# Patient Record
Sex: Female | Born: 1955
Health system: Southern US, Community
[De-identification: ages and names within clinical notes are randomized; demographics above are authoritative.]

## PROBLEM LIST (undated history)

## (undated) ENCOUNTER — Telehealth

## (undated) ENCOUNTER — Encounter

## (undated) ENCOUNTER — Encounter: Attending: Nephrology | Primary: Nephrology

## (undated) ENCOUNTER — Telehealth: Attending: Nephrology | Primary: Nephrology

## (undated) ENCOUNTER — Ambulatory Visit: Payer: PRIVATE HEALTH INSURANCE

## (undated) ENCOUNTER — Ambulatory Visit

## (undated) ENCOUNTER — Encounter: Payer: PRIVATE HEALTH INSURANCE | Attending: Nephrology | Primary: Nephrology

## (undated) ENCOUNTER — Telehealth
Attending: Student in an Organized Health Care Education/Training Program | Primary: Student in an Organized Health Care Education/Training Program

## (undated) ENCOUNTER — Ambulatory Visit: Payer: PRIVATE HEALTH INSURANCE | Attending: Nephrology | Primary: Nephrology

## (undated) ENCOUNTER — Non-Acute Institutional Stay: Payer: PRIVATE HEALTH INSURANCE

## (undated) ENCOUNTER — Ambulatory Visit: Payer: MEDICARE | Attending: Nephrology | Primary: Nephrology

## (undated) ENCOUNTER — Non-Acute Institutional Stay: Payer: PRIVATE HEALTH INSURANCE | Attending: Nephrology | Primary: Nephrology

## (undated) DIAGNOSIS — N189 Chronic kidney disease, unspecified: Secondary | ICD-10-CM

## (undated) DIAGNOSIS — G4733 Obstructive sleep apnea (adult) (pediatric): Secondary | ICD-10-CM

## (undated) DIAGNOSIS — Q613 Polycystic kidney, unspecified: Secondary | ICD-10-CM

## (undated) DIAGNOSIS — I639 Cerebral infarction, unspecified: Secondary | ICD-10-CM

## (undated) DIAGNOSIS — D259 Leiomyoma of uterus, unspecified: Secondary | ICD-10-CM

## (undated) DIAGNOSIS — R233 Spontaneous ecchymoses: Secondary | ICD-10-CM

## (undated) DIAGNOSIS — Z960 Presence of urogenital implants: Secondary | ICD-10-CM

## (undated) DIAGNOSIS — K649 Unspecified hemorrhoids: Secondary | ICD-10-CM

## (undated) DIAGNOSIS — K5790 Diverticulosis of intestine, part unspecified, without perforation or abscess without bleeding: Secondary | ICD-10-CM

## (undated) DIAGNOSIS — R091 Pleurisy: Secondary | ICD-10-CM

## (undated) DIAGNOSIS — D649 Anemia, unspecified: Secondary | ICD-10-CM

## (undated) DIAGNOSIS — R238 Other skin changes: Secondary | ICD-10-CM

## (undated) DIAGNOSIS — K5792 Diverticulitis of intestine, part unspecified, without perforation or abscess without bleeding: Secondary | ICD-10-CM

## (undated) DIAGNOSIS — I10 Essential (primary) hypertension: Secondary | ICD-10-CM

## (undated) DIAGNOSIS — K828 Other specified diseases of gallbladder: Secondary | ICD-10-CM

## (undated) DIAGNOSIS — M792 Neuralgia and neuritis, unspecified: Secondary | ICD-10-CM

## (undated) DIAGNOSIS — M1990 Unspecified osteoarthritis, unspecified site: Secondary | ICD-10-CM

## (undated) DIAGNOSIS — E785 Hyperlipidemia, unspecified: Secondary | ICD-10-CM

## (undated) HISTORY — DX: Unspecified hemorrhoids: K64.9

## (undated) HISTORY — PX: HIP SURGERY: SHX245

## (undated) HISTORY — DX: Presence of urogenital implants: Z96.0

## (undated) HISTORY — DX: Chronic kidney disease, unspecified: N18.9

## (undated) HISTORY — PX: TONSILLECTOMY: SUR1361

## (undated) HISTORY — DX: Leiomyoma of uterus, unspecified: D25.9

## (undated) HISTORY — DX: Obstructive sleep apnea (adult) (pediatric): G47.33

## (undated) HISTORY — DX: Other specified diseases of gallbladder: K82.8

## (undated) HISTORY — DX: Anemia, unspecified: D64.9

## (undated) HISTORY — DX: Neuralgia and neuritis, unspecified: M79.2

## (undated) HISTORY — DX: Hyperlipidemia, unspecified: E78.5

## (undated) HISTORY — DX: Diverticulosis of intestine, part unspecified, without perforation or abscess without bleeding: K57.90

## (undated) HISTORY — DX: Diverticulitis of intestine, part unspecified, without perforation or abscess without bleeding: K57.92

## (undated) HISTORY — PX: WRIST FRACTURE SURGERY: SHX121

## (undated) HISTORY — PX: FRACTURE SURGERY: SHX138

## (undated) HISTORY — PX: ANKLE FRACTURE SURGERY: SHX122

## (undated) HISTORY — PX: MANDIBLE SURGERY: SHX707

---
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
## 1978-06-01 HISTORY — PX: OTHER SURGICAL HISTORY: SHX169

## 1998-02-06 ENCOUNTER — Other Ambulatory Visit: Admission: RE | Admit: 1998-02-06 | Discharge: 1998-02-06 | Payer: Self-pay | Admitting: Nephrology

## 1999-09-04 ENCOUNTER — Other Ambulatory Visit: Admission: RE | Admit: 1999-09-04 | Discharge: 1999-09-04 | Payer: Self-pay | Admitting: Obstetrics and Gynecology

## 1999-12-11 ENCOUNTER — Other Ambulatory Visit: Admission: RE | Admit: 1999-12-11 | Discharge: 1999-12-11 | Payer: Self-pay | Admitting: Obstetrics and Gynecology

## 2000-03-22 ENCOUNTER — Other Ambulatory Visit: Admission: RE | Admit: 2000-03-22 | Discharge: 2000-03-22 | Payer: Self-pay | Admitting: Obstetrics and Gynecology

## 2000-09-08 ENCOUNTER — Other Ambulatory Visit: Admission: RE | Admit: 2000-09-08 | Discharge: 2000-09-08 | Payer: Self-pay | Admitting: Obstetrics and Gynecology

## 2001-03-13 ENCOUNTER — Other Ambulatory Visit: Admission: RE | Admit: 2001-03-13 | Discharge: 2001-03-13 | Payer: Self-pay | Admitting: Obstetrics and Gynecology

## 2001-09-07 ENCOUNTER — Other Ambulatory Visit: Admission: RE | Admit: 2001-09-07 | Discharge: 2001-09-07 | Payer: Self-pay | Admitting: Obstetrics and Gynecology

## 2002-10-18 ENCOUNTER — Other Ambulatory Visit: Admission: RE | Admit: 2002-10-18 | Discharge: 2002-10-18 | Payer: Self-pay | Admitting: Obstetrics and Gynecology

## 2002-11-01 HISTORY — PX: LAPAROSCOPY: SHX197

## 2002-11-01 HISTORY — PX: OTHER SURGICAL HISTORY: SHX169

## 2003-11-15 ENCOUNTER — Other Ambulatory Visit: Admission: RE | Admit: 2003-11-15 | Discharge: 2003-11-15 | Payer: Self-pay | Admitting: Obstetrics and Gynecology

## 2004-11-24 ENCOUNTER — Other Ambulatory Visit: Admission: RE | Admit: 2004-11-24 | Discharge: 2004-11-24 | Payer: Self-pay | Admitting: *Deleted

## 2005-01-15 ENCOUNTER — Ambulatory Visit (HOSPITAL_COMMUNITY): Admission: RE | Admit: 2005-01-15 | Discharge: 2005-01-15 | Payer: Self-pay | Admitting: Orthopaedic Surgery

## 2005-01-15 ENCOUNTER — Ambulatory Visit (HOSPITAL_BASED_OUTPATIENT_CLINIC_OR_DEPARTMENT_OTHER): Admission: RE | Admit: 2005-01-15 | Discharge: 2005-01-15 | Payer: Self-pay | Admitting: Orthopaedic Surgery

## 2005-12-28 ENCOUNTER — Other Ambulatory Visit: Admission: RE | Admit: 2005-12-28 | Discharge: 2005-12-28 | Payer: Self-pay | Admitting: Obstetrics & Gynecology

## 2006-12-01 DIAGNOSIS — Q612 Polycystic kidney, adult type: Secondary | ICD-10-CM | POA: Insufficient documentation

## 2006-12-29 ENCOUNTER — Other Ambulatory Visit: Admission: RE | Admit: 2006-12-29 | Discharge: 2006-12-29 | Payer: Self-pay | Admitting: Obstetrics & Gynecology

## 2008-01-04 ENCOUNTER — Other Ambulatory Visit: Admission: RE | Admit: 2008-01-04 | Discharge: 2008-01-04 | Payer: Self-pay | Admitting: Obstetrics & Gynecology

## 2008-07-17 ENCOUNTER — Encounter: Admission: RE | Admit: 2008-07-17 | Discharge: 2008-07-17 | Payer: Self-pay | Admitting: Orthopaedic Surgery

## 2009-01-08 ENCOUNTER — Other Ambulatory Visit: Admission: RE | Admit: 2009-01-08 | Discharge: 2009-01-08 | Payer: Self-pay | Admitting: Obstetrics & Gynecology

## 2009-10-21 ENCOUNTER — Encounter: Admission: RE | Admit: 2009-10-21 | Discharge: 2009-10-21 | Payer: Self-pay | Admitting: Orthopaedic Surgery

## 2009-11-01 DIAGNOSIS — I639 Cerebral infarction, unspecified: Secondary | ICD-10-CM

## 2009-11-01 HISTORY — DX: Cerebral infarction, unspecified: I63.9

## 2010-06-13 DIAGNOSIS — I6350 Cerebral infarction due to unspecified occlusion or stenosis of unspecified cerebral artery: Secondary | ICD-10-CM | POA: Insufficient documentation

## 2010-11-26 ENCOUNTER — Encounter
Admission: RE | Admit: 2010-11-26 | Discharge: 2010-11-26 | Payer: Self-pay | Source: Home / Self Care | Attending: Orthopaedic Surgery | Admitting: Orthopaedic Surgery

## 2011-03-19 NOTE — Op Note (Signed)
Linda Gilmore, Linda Gilmore              ACCOUNT NO.:  [id number]   MEDICAL RECORD NO.:  [id number]          PATIENT TYPE:  AMB   LOCATION:  DSC                          FACILITY:  [facility]   PHYSICIAN:  Claude Manges. Whitfield, M.D.DATE OF BIRTH:  [birth year]   DATE OF PROCEDURE:  01/15/2005  DATE OF DISCHARGE:                                 OPERATIVE REPORT   PREOPERATIVE DIAGNOSIS:  Traumatic arthrosis left ankle with synovitis.   POSTOPERATIVE DIAGNOSIS:  Traumatic arthrosis left ankle with synovitis.   PROCEDURE:  Arthroscopic debridement of articular cartilage left ankle with  synovectomy.   SURGEON:  Claude Manges. Cleophas Dunker, M.D.   ASSISTANT:  Richardean Canal, P.A.   ANESTHESIA:  General LMA.   COMPLICATIONS:  None.   INDICATIONS FOR PROCEDURE:  This 56 year old female has developed a  traumatic arthrosis of her left ankle as the result of an accident many  years ago.  She has been followed over a period of years with occasional  cortisone injections and even recently underwent a series of five Hyalgan  injections.  She continues to have pain and swelling on a daily basis  without much relief.  She is unable to take prolonged courses of anti-  inflammatory medicines as a result of renal disease.  Therefore, is limited  in her treatment.  She has had a recent MRI scan that revealed diffuse  degenerative changes with even subchondral cysts consistent with her  arthrosis.  She will now have an arthroscopy to evaluate the ankle and  possibly debride any synovitis which may ultimately make a difference.   DESCRIPTION OF PROCEDURE:  With the patient comfortable on the operating  table and under LMA general anesthesia, a tourniquet was applied to the left  lower extremity.  The left lower extremity was appropriately identified as  the operative extremity preoperatively.  The left lower extremity was then  prepped with Duraprep from the tips of the toes to the knee with Duraprep.  Sterile  draping was performed.   A marking pen was used to outline the arthroscopic portals.  Medial to the  anterior tibial tendon and lateral to the extensor tendons.  These portals  were then infiltrated with 0.25% Marcaine with epinephrine.  A small stab  wound was made in the skin and then Hemostats were used to separate soft  tissue through the capsule.  The smooth ankle scope is then placed into the  joint.  A diagnostic arthroscopy revealed diffuse areas of articular  cartilage loss both under the articular surface of the tibia as well as the  talus.  There was diffuse synovitis in both the medial and lateral gutters  and even anteriorly.  A second portal was established laterally with a  similar procedure using the Hemostats to elevate soft tissue.  The small  nonaggressive shaver was introduced.  A synovectomy was performed.  A small  angled ArthroCare wand was also inserted to remove any synovitis.  I thought  we had a very nice synovectomy.  She was having more trouble in the lateral  gutter where there was more synovitis and this was  completely debrided.  Any  loose areas of articular cartilage were also removed.  Without any evidence  of any further synovitis, the joint was irrigated with saline solution and  the medial puncture site was closed with interrupted 4-0 Ethilon, the  lateral  puncture site was left open.  The wounds were again infiltrated with 0.25%  Marcaine with epinephrine and sterile bulky dressing was applied followed by  an Ace bandage and an Futures trader.   PLAN:  Office in 1 week, crutches, Vicodin for pain.      PWW/MEDQ  D:  01/15/2005  T:  01/15/2005  Job:  [id number]

## 2011-04-12 ENCOUNTER — Other Ambulatory Visit: Payer: Self-pay | Admitting: Orthopaedic Surgery

## 2011-04-12 DIAGNOSIS — M25511 Pain in right shoulder: Secondary | ICD-10-CM

## 2011-04-17 ENCOUNTER — Other Ambulatory Visit: Payer: Self-pay

## 2011-04-22 ENCOUNTER — Other Ambulatory Visit: Payer: Self-pay | Admitting: Orthopaedic Surgery

## 2011-04-22 DIAGNOSIS — M542 Cervicalgia: Secondary | ICD-10-CM

## 2011-04-25 ENCOUNTER — Ambulatory Visit
Admission: RE | Admit: 2011-04-25 | Discharge: 2011-04-25 | Disposition: A | Discharge status: Home / Self Care | Payer: Commercial Managed Care - PPO | Source: Ambulatory Visit | Attending: Orthopaedic Surgery | Admitting: Orthopaedic Surgery

## 2011-04-25 DIAGNOSIS — M25511 Pain in right shoulder: Secondary | ICD-10-CM

## 2011-04-25 DIAGNOSIS — M542 Cervicalgia: Secondary | ICD-10-CM

## 2011-04-25 MED ORDER — GADOBENATE DIMEGLUMINE 529 MG/ML IV SOLN
10.0000 mL | Freq: Once | INTRAVENOUS | Status: AC | PRN
  Administered 2011-04-25: 10 mL via INTRAVENOUS

## 2012-05-29 ENCOUNTER — Ambulatory Visit: Payer: Commercial Managed Care - PPO | Admitting: Physical Therapy

## 2012-05-31 ENCOUNTER — Ambulatory Visit: Payer: 59 | Attending: Family Medicine | Admitting: Physical Therapy

## 2012-05-31 DIAGNOSIS — R269 Unspecified abnormalities of gait and mobility: Secondary | ICD-10-CM | POA: Insufficient documentation

## 2012-05-31 DIAGNOSIS — IMO0001 Reserved for inherently not codable concepts without codable children: Secondary | ICD-10-CM | POA: Insufficient documentation

## 2012-06-01 ENCOUNTER — Ambulatory Visit: Payer: 59 | Attending: Family Medicine | Admitting: Physical Therapy

## 2012-06-01 DIAGNOSIS — IMO0001 Reserved for inherently not codable concepts without codable children: Secondary | ICD-10-CM | POA: Insufficient documentation

## 2012-06-01 DIAGNOSIS — R269 Unspecified abnormalities of gait and mobility: Secondary | ICD-10-CM | POA: Insufficient documentation

## 2012-06-14 ENCOUNTER — Ambulatory Visit: Payer: Commercial Managed Care - PPO | Admitting: *Deleted

## 2012-06-16 ENCOUNTER — Ambulatory Visit: Payer: 59 | Admitting: *Deleted

## 2012-06-19 ENCOUNTER — Ambulatory Visit: Payer: Commercial Managed Care - PPO | Admitting: Physical Therapy

## 2012-06-20 ENCOUNTER — Emergency Department (HOSPITAL_COMMUNITY): Payer: 59

## 2012-06-20 ENCOUNTER — Emergency Department (HOSPITAL_COMMUNITY)
Admission: EM | Admit: 2012-06-20 | Discharge: 2012-06-20 | Disposition: A | Discharge status: Home / Self Care | Payer: 59 | Attending: Emergency Medicine | Admitting: Emergency Medicine

## 2012-06-20 ENCOUNTER — Encounter (HOSPITAL_COMMUNITY): Payer: Self-pay | Admitting: *Deleted

## 2012-06-20 ENCOUNTER — Encounter (HOSPITAL_COMMUNITY): Payer: Self-pay | Admitting: Emergency Medicine

## 2012-06-20 DIAGNOSIS — K5792 Diverticulitis of intestine, part unspecified, without perforation or abscess without bleeding: Secondary | ICD-10-CM

## 2012-06-20 DIAGNOSIS — Z8673 Personal history of transient ischemic attack (TIA), and cerebral infarction without residual deficits: Secondary | ICD-10-CM | POA: Insufficient documentation

## 2012-06-20 DIAGNOSIS — I10 Essential (primary) hypertension: Secondary | ICD-10-CM | POA: Insufficient documentation

## 2012-06-20 DIAGNOSIS — R10814 Left lower quadrant abdominal tenderness: Secondary | ICD-10-CM | POA: Insufficient documentation

## 2012-06-20 DIAGNOSIS — R109 Unspecified abdominal pain: Secondary | ICD-10-CM | POA: Insufficient documentation

## 2012-06-20 DIAGNOSIS — K5732 Diverticulitis of large intestine without perforation or abscess without bleeding: Secondary | ICD-10-CM | POA: Insufficient documentation

## 2012-06-20 DIAGNOSIS — K7689 Other specified diseases of liver: Secondary | ICD-10-CM | POA: Insufficient documentation

## 2012-06-20 HISTORY — DX: Polycystic kidney, unspecified: Q61.3

## 2012-06-20 HISTORY — DX: Essential (primary) hypertension: I10

## 2012-06-20 HISTORY — DX: Unspecified osteoarthritis, unspecified site: M19.90

## 2012-06-20 HISTORY — DX: Diverticulitis of intestine, part unspecified, without perforation or abscess without bleeding: K57.92

## 2012-06-20 HISTORY — DX: Cerebral infarction, unspecified: I63.9

## 2012-06-20 LAB — URINALYSIS, ROUTINE W REFLEX MICROSCOPIC
Bilirubin Urine: NEGATIVE
Glucose, UA: NEGATIVE mg/dL
Ketones, ur: NEGATIVE mg/dL
Protein, ur: 100 mg/dL — AB
Urobilinogen, UA: 0.2 mg/dL (ref 0.0–1.0)

## 2012-06-20 LAB — POCT I-STAT, CHEM 8
BUN: 35 mg/dL — ABNORMAL HIGH (ref 6–23)
Calcium, Ion: 1.31 mmol/L — ABNORMAL HIGH (ref 1.12–1.23)
Hemoglobin: 11.9 g/dL — ABNORMAL LOW (ref 12.0–15.0)
Sodium: 140 mEq/L (ref 135–145)
TCO2: 24 mmol/L (ref 0–100)

## 2012-06-20 LAB — CBC WITH DIFFERENTIAL/PLATELET
Basophils Absolute: 0 10*3/uL (ref 0.0–0.1)
Basophils Relative: 0 % (ref 0–1)
Eosinophils Absolute: 0.1 10*3/uL (ref 0.0–0.7)
Eosinophils Relative: 1 % (ref 0–5)
HCT: 35.5 % — ABNORMAL LOW (ref 36.0–46.0)
Lymphocytes Relative: 8 % — ABNORMAL LOW (ref 12–46)
MCH: 29.3 pg (ref 26.0–34.0)
MCHC: 33 g/dL (ref 30.0–36.0)
MCV: 88.8 fL (ref 78.0–100.0)
Monocytes Absolute: 0.7 10*3/uL (ref 0.1–1.0)
Platelets: 194 10*3/uL (ref 150–400)
RDW: 12.8 % (ref 11.5–15.5)

## 2012-06-20 LAB — URINE MICROSCOPIC-ADD ON

## 2012-06-20 MED ORDER — IOHEXOL 300 MG/ML  SOLN: 20.0000 mL | INTRAMUSCULAR | Status: DC

## 2012-06-20 MED ORDER — CIPROFLOXACIN HCL 500 MG PO TABS: 500.0000 mg | ORAL_TABLET | Freq: Two times a day (BID) | ORAL | Status: AC

## 2012-06-20 MED ORDER — METRONIDAZOLE 500 MG PO TABS: 500.0000 mg | ORAL_TABLET | Freq: Two times a day (BID) | ORAL | Status: AC

## 2012-06-20 NOTE — ED Notes (Signed)
Transported to CT scan via stretcher per xray staff

## 2012-06-20 NOTE — ED Notes (Signed)
Patient states she is leaving without being seen and will return after she takes her son to his doctors appointment.

## 2012-06-20 NOTE — ED Notes (Signed)
Pt was here earlier today and had to leave and is now back c/o LLQ pain; pt went to [facility] and nephrologist today; pt with polycystic kidney disease and thinks it could be cyst rupture; pt had labs in previous visit

## 2012-06-20 NOTE — ED Provider Notes (Signed)
Medical screening examination/treatment/procedure(s) were performed by non-physician practitioner and as supervising physician I was immediately available for consultation/collaboration.   Richardean Canal, MD 06/20/12 [phone]

## 2012-06-20 NOTE — ED Provider Notes (Signed)
History     CSN: [id number]  Arrival date & time 06/20/12  1739   First MD Initiated Contact with Patient 06/20/12 1942      Chief Complaint  Patient presents with  . Abdominal Pain    (Consider location/radiation/quality/duration/timing/severity/associated sxs/prior treatment) HPI Comments: 56 y/o female presents with sudden onset abdominal pain last night while laying in bed. Came to ED earlier today, but had to leave before all tests were complete in order to take her son to an appt and said she would return later. Pain last night was throbbing with occasional sharp episodes rated 8/10. Pain decreased today to 4/10 worse with movement. Went to her gynecologist this morning who examined her and said she may have a cyst or a kidney stone, but did not have ultrasound in office and told her to see her nephrologist. She has hx of polycystic kidney disease. Went to nephrologists office who could not see her today and advised her to go to ED for a scan. Labs were drawn earlier today. States pain is located mostly in left suprapubic region. Denies any dysuria, hematuria, back pain, fever, chills, nausea, vomiting, chest pain, sob, vaginal bleeding or discharge.  Patient is a 56 y.o. female presenting with abdominal pain. The history is provided by the patient.  Abdominal Pain The primary symptoms of the illness include abdominal pain. The primary symptoms of the illness do not include fever, shortness of breath, nausea, vomiting, dysuria, vaginal discharge or vaginal bleeding.  Symptoms associated with the illness do not include chills, hematuria, frequency or back pain.    Past Medical History  Diagnosis Date  . Hypertension   . Stroke   . Arthritis   . Polycystic kidney disease     Past Surgical History  Procedure Date  . Tonsillectomy   . Joint replacement     multiple surgery  . Mandible surgery     History reviewed. No pertinent family history.  History  Substance Use Topics   . Smoking status: Never Smoker   . Smokeless tobacco: Not on file  . Alcohol Use: No    OB History    Grav Para Term Preterm Abortions TAB SAB Ect Mult Living                  Review of Systems  Constitutional: Negative for fever and chills.  Respiratory: Negative for shortness of breath.   Cardiovascular: Negative for chest pain.  Gastrointestinal: Positive for abdominal pain. Negative for nausea and vomiting.  Genitourinary: Negative for dysuria, frequency, hematuria, vaginal bleeding and vaginal discharge.  Musculoskeletal: Negative for back pain.    Allergies  Sulfa antibiotics  Home Medications   Current Outpatient Rx  Name Route Sig Dispense Refill  . AMLODIPINE BESYLATE 2.5 MG PO TABS Oral Take 2.5 mg by mouth daily.    . ASPIRIN EC 81 MG PO TBEC Oral Take 81 mg by mouth daily.    . ATORVASTATIN CALCIUM 10 MG PO TABS Oral Take 10 mg by mouth daily.    Marland Kitchen GLUCOSAMINE CHOND COMPLEX/MSM PO Oral Take 1 tablet by mouth 2 (two) times daily.    . ADULT MULTIVITAMIN W/MINERALS CH Oral Take 1 tablet by mouth daily.    . OMEGA-3-ACID ETHYL ESTERS 1 G PO CAPS Oral Take 2 g by mouth 3 (three) times daily.    Marland Kitchen PROBIOTIC DAILY PO CAPS Oral Take 1 capsule by mouth daily.    . TOLVAPTAN 15 MG PO TABS Oral Take 15-45 mg  by mouth 2 (two) times daily.    Marland Kitchen VALSARTAN 160 MG PO TABS Oral Take 160 mg by mouth 2 (two) times daily.    Marland Kitchen VITAMIN D (ERGOCALCIFEROL) 50000 UNITS PO CAPS Oral Take 50,000 Units by mouth every 7 (seven) days. Takes on Sunday      BP 148/81  Pulse 70  Temp 97 F (36.1 C) (Oral)  Resp 17  SpO2 97%  Physical Exam  Constitutional: She is oriented to person, place, and time. She appears well-developed and well-nourished. No distress.  HENT:  Head: Normocephalic and atraumatic.  Mouth/Throat: Oropharynx is clear and moist.  Eyes: Conjunctivae are normal.  Neck: Neck supple.  Cardiovascular: Normal rate, regular rhythm and normal heart sounds.     Pulmonary/Chest: Effort normal and breath sounds normal.  Abdominal: Soft. Normal appearance and bowel sounds are normal. There is tenderness in the left lower quadrant. There is no rigidity, no rebound, no guarding and no CVA tenderness.  Neurological: She is alert and oriented to person, place, and time.  Skin: Skin is warm and dry.  Psychiatric: She has a normal mood and affect. Her speech is normal and behavior is normal.    ED Course  Procedures (including critical care time)  Labs Reviewed - No data to display Ct Abdomen Pelvis Wo Contrast  06/20/2012  *RADIOLOGY REPORT*  Clinical Data: Left lower quadrant pain.  Microhematuria.  CT ABDOMEN AND PELVIS WITHOUT CONTRAST  Technique:  Multidetector CT imaging of the abdomen and pelvis was performed following the standard protocol without intravenous contrast.  Comparison: None.  Findings: Minimal dependent changes in the lung bases.  The kidneys are diffusely enlarged bilaterally with parenchymal replacement by multiple cysts consistent with polycystic kidney disease.  Multiple cysts in the liver consistent with associated polycystic liver disease.  No pyelocaliectasis or ureterectasis.  No renal, ureteral, or bladder stones.  No bladder wall thickening.  The unenhanced appearance of the gallbladder, spleen, pancreas, adrenal glands, abdominal aorta, and retroperitoneal lymph nodes is unremarkable.  Stomach, small bowel, and colon are not abnormally distended.  No free air or free fluid in the abdomen.  Pelvis:  Diverticulosis of the sigmoid colon.  Inflammatory infiltration in the pericolonic fat at the junction of the descending and sigmoid region consistent with diverticulitis.  No diverticular abscess is demonstrated.  No loculated fluid collections.  Lobular appearance of the uterus suggesting fibroids. The appendix is normal.  Normal alignment of the lumbar vertebrae.  IMPRESSION: Polycystic kidney and liver disease.  No renal or ureteral stone  or obstruction.  Left lower quadrant diverticulitis.   Original Report Authenticated By: Marlon Pel, M.D.    Labs from earlier today: URINALYSIS, ROUTINE W REFLEX MICROSCOPIC - Abnormal; Notable for the following:  APPearance  HAZY (*)  Hgb urine dipstick  LARGE (*)  Protein, ur  100 (*)  Leukocytes, UA  MODERATE (*)  All other components within normal limits  CBC WITH DIFFERENTIAL - Abnormal; Notable for the following:  WBC  10.9 (*)  Hemoglobin  11.7 (*)  HCT  35.5 (*)  Neutrophils Relative  85 (*)  Neutro Abs  9.3 (*)  Lymphocytes Relative  8 (*)  All other components within normal limits  POCT I-STAT, CHEM 8 - Abnormal; Notable for the following:  BUN  35 (*)  Creatinine, Ser  2.10 (*)  Glucose, Bld  152 (*)  Calcium, Ion  1.31 (*)  Hemoglobin  11.9 (*)  HCT  35.0 (*)  All other components  within normal limits  URINE MICROSCOPIC-ADD ON - Abnormal; Notable for the following:  Bacteria, UA  FEW (*)  All other components within normal limits    1. Diverticulitis   2. Liver cyst   3. Abdominal pain       MDM  56 y/o female with diverticulitis. Will treat with cipro and flagyl and give f/u with GI due to cysts seen on liver. Aware to f/u with PCP. Instructions for low residue diet discussed.        Trevor Mace, PA-C 06/20/12 2126

## 2012-06-20 NOTE — ED Notes (Signed)
Pt was sent here from Dr. Caryn Section office for an ultrasound.  PT started having LLQ Abdominal pain that increased since last nite,  Pt has polycystic kidney disease and sent here for possible kidney stone.

## 2012-06-20 NOTE — ED Notes (Signed)
From CT scan via stretcher per xray tech

## 2012-06-20 NOTE — ED Notes (Addendum)
Patient states she has been having abdominal pain that started last night approx 2200 and is getting worse as the day goes on today and is having pain when she walks. Patient denies N/V//F/D. Patient denies flank pain and denies pain with urination. Patient states she has to leave by 1545 to take her son to a doctors appointment and if not seen by doctor she will return after her son's appointment.

## 2012-06-23 ENCOUNTER — Ambulatory Visit: Payer: Commercial Managed Care - PPO | Admitting: Physical Therapy

## 2012-06-27 ENCOUNTER — Ambulatory Visit: Payer: Commercial Managed Care - PPO | Admitting: Physical Therapy

## 2012-06-29 ENCOUNTER — Ambulatory Visit: Payer: Commercial Managed Care - PPO | Admitting: Physical Therapy

## 2012-07-06 DIAGNOSIS — I671 Cerebral aneurysm, nonruptured: Secondary | ICD-10-CM | POA: Insufficient documentation

## 2013-04-24 ENCOUNTER — Other Ambulatory Visit: Payer: Self-pay | Admitting: Obstetrics & Gynecology

## 2013-05-14 ENCOUNTER — Encounter (INDEPENDENT_AMBULATORY_CARE_PROVIDER_SITE_OTHER): Payer: Self-pay

## 2013-05-16 ENCOUNTER — Encounter (INDEPENDENT_AMBULATORY_CARE_PROVIDER_SITE_OTHER): Payer: Self-pay | Admitting: General Surgery

## 2013-05-16 ENCOUNTER — Ambulatory Visit (INDEPENDENT_AMBULATORY_CARE_PROVIDER_SITE_OTHER): Payer: Commercial Managed Care - PPO | Admitting: General Surgery

## 2013-05-16 VITALS — BP 128/76 | HR 64 | Temp 97.6°F | Resp 14 | Ht 67.0 in | Wt 181.2 lb

## 2013-05-16 DIAGNOSIS — K5732 Diverticulitis of large intestine without perforation or abscess without bleeding: Secondary | ICD-10-CM

## 2013-05-16 NOTE — Patient Instructions (Signed)
Stay with the high fiber diet.

## 2013-05-16 NOTE — Progress Notes (Signed)
Patient ID: Linda Gilmore, female   DOB: 1956-05-19, 57 y.o.   MRN: [id number]  Chief Complaint  Patient presents with  . New Evaluation    eval sigmoid diverticulitis    HPI Linda Gilmore is a 57 y.o. female.   HPI  She is referred by Dr. Loreta Ave for further evaluation and treatment of recurrent sigmoid diverticulitis. Her first episode was in August of 2013 and is documented by way of CT scan. She responded to outpatient oral antibiotic therapy. She has had 2 other episodes since that time with the last episode in April. She started oral antibiotic therapy early and these episodes have resolved. She is trying to maintain a high fiber diet. Recent colonoscopy demonstrated significant sigmoid diverticulosis. There were a few small polyps but no malignancies.  She is here to discuss surgery for recurrent sigmoid diverticulitis.  Past Medical History  Diagnosis Date  . Hypertension   . Arthritis   . Anemia   . Hyperlipidemia   . Hemorrhoids   . Pneumonia   . Stroke 2011  . Diverticulosis   . Diverticulitis   . Polycystic kidney disease   . CKD (chronic kidney disease)     Past Surgical History  Procedure Laterality Date  . Tonsillectomy    . Mandible surgery    . Fracture surgery      Lower extremity    History reviewed. No pertinent family history.  Social History History  Substance Use Topics  . Smoking status: Never Smoker   . Smokeless tobacco: Never Used  . Alcohol Use: No    Allergies  Allergen Reactions  . Sulfa Antibiotics     Current Outpatient Prescriptions  Medication Sig Dispense Refill  . amLODipine (NORVASC) 2.5 MG tablet Take 5 mg by mouth daily.       Marland Kitchen atorvastatin (LIPITOR) 10 MG tablet Take 10 mg by mouth daily.      . metroNIDAZOLE (FLAGYL) 250 MG tablet Take 250 mg by mouth 3 (three) times daily.      . Misc Natural Products (GLUCOSAMINE CHOND COMPLEX/MSM PO) Take 1 tablet by mouth 2 (two) times daily.      . Multiple Vitamin (MULTIVITAMIN  WITH MINERALS) TABS Take 1 tablet by mouth daily.      Marland Kitchen omega-3 acid ethyl esters (LOVAZA) 1 G capsule Take 2 g by mouth 3 (three) times daily.      . Probiotic Product (PROBIOTIC DAILY) CAPS Take 1 capsule by mouth daily.      Marland Kitchen tolvaptan (SAMSCA) 15 MG TABS Take 15-45 mg by mouth 2 (two) times daily.      . valsartan (DIOVAN) 160 MG tablet Take 160 mg by mouth 2 (two) times daily.      . Vitamin D, Ergocalciferol, (DRISDOL) 50000 UNITS CAPS Take 50,000 Units by mouth every 7 (seven) days. Takes on Sunday      . aspirin EC 81 MG tablet Take 81 mg by mouth daily.       No current facility-administered medications for this visit.    Review of Systems Review of Systems  Constitutional: Negative.   HENT: Positive for congestion.   Respiratory: Negative.   Cardiovascular: Negative.   Gastrointestinal: Positive for anal bleeding.  Genitourinary: Negative.   Allergic/Immunologic: Negative.   Neurological: Negative.     Blood pressure 128/76, pulse 64, temperature 97.6 F (36.4 C), temperature source Temporal, resp. rate 14, height 5\' 7"  (1.702 m), weight 181 lb 3.2 oz (82.192 kg).  Physical Exam  Physical Exam  Constitutional: No distress.  Overweight.  HENT:  Head: Normocephalic and atraumatic.  Eyes: No scleral icterus.  Cardiovascular: Normal rate and regular rhythm.   Pulmonary/Chest: Effort normal and breath sounds normal.  Abdominal: Soft. She exhibits no distension and no mass. There is no tenderness.  Musculoskeletal:  Right foot edema.  Skin: Skin is warm and dry.  Psychiatric: She has a normal mood and affect. Her behavior is normal.    Data Reviewed Notes from Dr. Loreta Ave. Colonoscopy. CT scan from August 2013.  Assessment    Recurrent sigmoid diverticulitis. Does have some chronic kidney disease but this appears to be under good control. She would be a candidate for laparoscopic-assisted partial colectomy and I discussed this with her and she is interested. She would  like to schedule it sometime in the fall after she has moved her son into college.     Plan    Laparoscopic-assisted partial colectomy. We'll have her come back in 6 weeks to go over the operation again and scheduled for surgery.  I did explain the procedure and risks of colon resection to her today.  Risks include but are not limited to bleeding, infection, wound problems, anesthesia, anastomotic leak, need for colostomy, injury to intraabominal organs (such as intestine, spleen, kidney, bladder, ureter, etc.), ileus, irregular bowel habits.  She seems to understand.       Arlee Santosuosso J 05/16/2013, 12:29 PM

## 2013-05-21 ENCOUNTER — Encounter (INDEPENDENT_AMBULATORY_CARE_PROVIDER_SITE_OTHER): Payer: Self-pay

## 2013-05-29 ENCOUNTER — Ambulatory Visit: Payer: Self-pay | Admitting: Obstetrics & Gynecology

## 2013-06-07 ENCOUNTER — Ambulatory Visit: Payer: Self-pay | Admitting: Obstetrics & Gynecology

## 2013-06-07 ENCOUNTER — Ambulatory Visit (INDEPENDENT_AMBULATORY_CARE_PROVIDER_SITE_OTHER): Payer: Commercial Managed Care - PPO | Admitting: Obstetrics & Gynecology

## 2013-06-07 ENCOUNTER — Encounter: Payer: Self-pay | Admitting: Obstetrics & Gynecology

## 2013-06-07 VITALS — BP 122/72 | HR 60 | Resp 16 | Ht 66.5 in | Wt 179.2 lb

## 2013-06-07 DIAGNOSIS — Z0000 Encounter for general adult medical examination without abnormal findings: Secondary | ICD-10-CM

## 2013-06-07 DIAGNOSIS — Z01419 Encounter for gynecological examination (general) (routine) without abnormal findings: Secondary | ICD-10-CM

## 2013-06-07 LAB — POCT URINALYSIS DIPSTICK
Bilirubin, UA: NEGATIVE
Ketones, UA: NEGATIVE
pH, UA: 5
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
## 2013-06-07 NOTE — Patient Instructions (Signed)

## 2013-06-21 ENCOUNTER — Ambulatory Visit: Payer: Self-pay | Admitting: Obstetrics & Gynecology

## 2013-07-10 ENCOUNTER — Encounter (INDEPENDENT_AMBULATORY_CARE_PROVIDER_SITE_OTHER): Payer: Self-pay | Admitting: General Surgery

## 2013-07-10 ENCOUNTER — Ambulatory Visit (INDEPENDENT_AMBULATORY_CARE_PROVIDER_SITE_OTHER): Payer: Commercial Managed Care - PPO | Admitting: General Surgery

## 2013-07-10 VITALS — BP 130/88 | HR 80 | Temp 96.9°F | Resp 16 | Ht 67.0 in | Wt 182.4 lb

## 2013-07-10 DIAGNOSIS — K5732 Diverticulitis of large intestine without perforation or abscess without bleeding: Secondary | ICD-10-CM

## 2013-07-10 NOTE — Patient Instructions (Signed)
Diverticulitis °A diverticulum is a small pouch or sac on the colon. Diverticulosis is the presence of these diverticula on the colon. Diverticulitis is the irritation (inflammation) or infection of diverticula. °CAUSES  °The colon and its diverticula contain bacteria. If food particles block the tiny opening to a diverticulum, the bacteria inside can grow and cause an increase in pressure. This leads to infection and inflammation and is called diverticulitis. °SYMPTOMS  °· Abdominal pain and tenderness. Usually, the pain is located on the left side of your abdomen. However, it could be located elsewhere. °· Fever. °· Bloating. °· Feeling sick to your stomach (nausea). °· Throwing up (vomiting). °· Abnormal stools. °DIAGNOSIS  °Your caregiver will take a history and perform a physical exam. Since many things can cause abdominal pain, other tests may be necessary. Tests may include: °· Blood tests. °· Urine tests. °· X-ray of the abdomen. °· CT scan of the abdomen. °Sometimes, surgery is needed to determine if diverticulitis or other conditions are causing your symptoms. °TREATMENT  °Most of the time, you can be treated without surgery. Treatment includes: °· Resting the bowels by only having liquids for a few days. As you improve, you will need to eat a low-fiber diet. °· Intravenous (IV) fluids if you are losing body fluids (dehydrated). °· Antibiotic medicines that treat infections may be given. °· Pain and nausea medicine, if needed. °· Surgery if the inflamed diverticulum has burst. °HOME CARE INSTRUCTIONS  °· Try a clear liquid diet (broth, tea, or water for as long as directed by your caregiver). You may then gradually begin a low-fiber diet as tolerated.  °A low-fiber diet is a diet with less than 10 grams of fiber. Choose the foods below to reduce fiber in the diet: °· White breads, cereals, rice, and pasta. °· Cooked fruits and vegetables or soft fresh fruits and vegetables without the skin. °· Ground or  well-cooked tender beef, ham, veal, lamb, pork, or poultry. °· Eggs and seafood. °· After your diverticulitis symptoms have improved, your caregiver may put you on a high-fiber diet. A high-fiber diet includes 14 grams of fiber for every 1000 calories consumed. For a standard 2000 calorie diet, you would need 28 grams of fiber. Follow these diet guidelines to help you increase the fiber in your diet. It is important to slowly increase the amount fiber in your diet to avoid gas, constipation, and bloating. °· Choose whole-grain breads, cereals, pasta, and brown rice. °· Choose fresh fruits and vegetables with the skin on. Do not overcook vegetables because the more vegetables are cooked, the more fiber is lost. °· Choose more nuts, seeds, legumes, dried peas, beans, and lentils. °· Look for food products that have greater than 3 grams of fiber per serving on the Nutrition Facts label. °· Take all medicine as directed by your caregiver. °· If your caregiver has given you a follow-up appointment, it is very important that you go. Not going could result in lasting (chronic) or permanent injury, pain, and disability. If there is any problem keeping the appointment, call to reschedule. °SEEK MEDICAL CARE IF:  °· Your pain does not improve. °· You have a hard time advancing your diet beyond clear liquids. °· Your bowel movements do not return to normal. °SEEK IMMEDIATE MEDICAL CARE IF:  °· Your pain becomes worse. °· You have an oral temperature above 102° F (38.9° C), not controlled by medicine. °· You have repeated vomiting. °· You have bloody or black, tarry stools. °·   Symptoms that brought you to your caregiver become worse or are not getting better. °MAKE SURE YOU:  °· Understand these instructions. °· Will watch your condition. °· Will get help right away if you are not doing well or get worse. °Document Released: 07/28/2005 Document Revised: 01/10/2012 Document Reviewed: 11/23/2010 °[employer]® Patient Information  ©2014 [employer]. ° °

## 2013-07-10 NOTE — Progress Notes (Signed)
Subjective:     Patient ID: [name], female   DOB: 1956-07-10, 57 y.o.   MRN: [id number]  HPI  She is here for followup of her recurrent bouts of sigmoid diverticulitis. She's had another episode since I last saw her. She's feeling better now. Cipro and Flagyl helped her get over the episode. She is trying to maintain a high fiber diet and hydration.   Review of [employer] has a big project coming up and wants to delay the surgery until January of 2015.     Objective:   Physical Exam Gen.-she looks well and is in no acute distress.  Abdomen-soft, no tenderness or mass.    Assessment:     Recurrent sigmoid diverticulitis-she is interested in proceeding with elective sigmoid colectomy but wants to wait until January of 2015.     Plan:     Continue high fiber diet and aggressive hydration. Return visit 3 months to schedule the surgery.

## 2013-07-13 ENCOUNTER — Encounter (INDEPENDENT_AMBULATORY_CARE_PROVIDER_SITE_OTHER): Payer: Self-pay | Admitting: General Surgery

## 2013-07-13 ENCOUNTER — Other Ambulatory Visit (INDEPENDENT_AMBULATORY_CARE_PROVIDER_SITE_OTHER): Payer: Self-pay | Admitting: General Surgery

## 2013-07-13 ENCOUNTER — Telehealth (INDEPENDENT_AMBULATORY_CARE_PROVIDER_SITE_OTHER): Payer: Self-pay | Admitting: General Surgery

## 2013-07-13 NOTE — Telephone Encounter (Signed)
Pt called in wanting surgery oct 6 th week. Explain that no orders and OV states please return to discuss surgery/ offered to get her an OV asap to see Dr TR she wanted to speak to someone who could write surgery orders bc she wants it that week.  Told orders only good 90 days after last OV 7/12 was her visit [facility] policy states she needs to be seen/

## 2013-07-13 NOTE — Telephone Encounter (Signed)
LMOV surgical orders sent to schedulers.  Dr. Abbey Chatters will enter orders in Epic.  She should hear from our schedulers early next week.

## 2013-07-13 NOTE — Telephone Encounter (Signed)
She was last seen on 07/10/13 so we can schedule the surgery.

## 2013-07-13 NOTE — Progress Notes (Signed)
Patient ID: Linda Gilmore, female   DOB: Oct 01, 1956, 57 y.o.   MRN: [id number] She has decided she wants to proceed with a sigmoid colectomy in October. We'll work on getting this scheduled.

## 2013-07-13 NOTE — Telephone Encounter (Signed)
Patient called to state that she has had an opening in her schedule and could actually have her surgery done the week of October 13th.  Patient wanting to know if this is possible, does she need to come back in to see you or can orders be placed so she can go ahead and be schedule for this week?  Explained that a message would be sent to Dr. Abbey Chatters to ask then we would let her know.  Patient states understanding and agreeable at this time.

## 2013-08-01 ENCOUNTER — Telehealth (INDEPENDENT_AMBULATORY_CARE_PROVIDER_SITE_OTHER): Payer: Self-pay

## 2013-08-01 NOTE — Telephone Encounter (Signed)
Pt scheduled for partial colectomy on 10/16 calling today with questions about the procedure.  Location and size of incisions explained to the patient.  She understood and was appreciative.

## 2013-08-08 ENCOUNTER — Encounter (HOSPITAL_COMMUNITY): Payer: Self-pay | Admitting: Pharmacy Technician

## 2013-08-10 ENCOUNTER — Inpatient Hospital Stay (HOSPITAL_COMMUNITY): Admission: RE | Admit: 2013-08-10 | Payer: 59 | Source: Ambulatory Visit

## 2013-08-10 NOTE — Patient Instructions (Addendum)
20      Your procedure is scheduled on:  Thursday 08/16/2013  Report to [facility] at  0530 AM.  Call this number if you have problems the night before or morning of surgery:  [phone]   Remember:              Do not eat food AFTER MIDNIGHT! TUESDAY NIGHT.               CLEAR LIQUIDS ALL DAY Wednesday 08-15-2013, NO LIQUIDS AFTER MIDNIGHT Wednesday NIGHT.   Take these medicines the morning of surgery with A SIP OF WATER: NO MEDS TO TAKE  Do not bring valuables to the hospital. [facility] IS NOT RESPONSIBLE FOR ANY BELONGINGS OR VALUABLES BROUGHT TO HOSPITAL.  Marland Kitchen  Leave suitcase in the car. After surgery it may be brought to your room.  For patients admitted to the hospital, checkout time is 11:00 AM the day of              Discharge.    DO NOT WEAR JEWELRY , MAKE-UP, LOTIONS,POWDERS,PERFUMES!             WOMEN -DO NOT SHAVE LEGS OR UNDERARMS 12 HRS. BEFORE SURGERY!               MEN MAY SHAVE AS USUAL!             CONTACTS,DENTURES OR BRIDGEWORK, FALSE EYELASHES MAY NOT BE WORN INTO SURGERY!                                           Patients discharged the day of surgery will not be allowed to drive home. If going home the same day of surgery, must have someone stay with you  first 24 hrs.at home and arrange for someone to drive you home from the Hospital.                          YOUR DRIVER IS:   Special Instructions:             Please read over the following fact sheets that you were given:             1. [facility] PREPARING FOR SURGERY SHEET              2.[facility]                                        Randolm Idol     [phone]                FAILURE TO FOLLOW THESE INSTRUCTIONS MAY RESULT IN   CANCELLATION OF YOUR SURGERY!               Patient Signature:___________________________

## 2013-08-13 ENCOUNTER — Ambulatory Visit (HOSPITAL_COMMUNITY)
Admission: RE | Admit: 2013-08-13 | Discharge: 2013-08-13 | Disposition: A | Discharge status: Home / Self Care | Payer: 59 | Source: Ambulatory Visit | Attending: General Surgery | Admitting: General Surgery

## 2013-08-13 ENCOUNTER — Encounter (HOSPITAL_COMMUNITY)
Admission: RE | Admit: 2013-08-13 | Discharge: 2013-08-13 | Disposition: A | Discharge status: Home / Self Care | Payer: 59 | Source: Ambulatory Visit | Attending: General Surgery | Admitting: General Surgery

## 2013-08-13 ENCOUNTER — Encounter (HOSPITAL_COMMUNITY): Payer: Self-pay

## 2013-08-13 ENCOUNTER — Other Ambulatory Visit: Payer: Self-pay

## 2013-08-13 DIAGNOSIS — Z01812 Encounter for preprocedural laboratory examination: Secondary | ICD-10-CM | POA: Insufficient documentation

## 2013-08-13 DIAGNOSIS — K5732 Diverticulitis of large intestine without perforation or abscess without bleeding: Secondary | ICD-10-CM | POA: Insufficient documentation

## 2013-08-13 DIAGNOSIS — Z01818 Encounter for other preprocedural examination: Secondary | ICD-10-CM | POA: Insufficient documentation

## 2013-08-13 DIAGNOSIS — Z01810 Encounter for preprocedural cardiovascular examination: Secondary | ICD-10-CM | POA: Insufficient documentation

## 2013-08-13 HISTORY — DX: Other skin changes: R23.8

## 2013-08-13 HISTORY — DX: Pleurisy: R09.1

## 2013-08-13 HISTORY — DX: Spontaneous ecchymoses: R23.3

## 2013-08-13 LAB — CBC WITH DIFFERENTIAL/PLATELET
Basophils Absolute: 0.1 10*3/uL (ref 0.0–0.1)
Basophils Relative: 1 % (ref 0–1)
Eosinophils Absolute: 0.2 10*3/uL (ref 0.0–0.7)
Eosinophils Relative: 4 % (ref 0–5)
Lymphs Abs: 1 10*3/uL (ref 0.7–4.0)
MCH: 30.9 pg (ref 26.0–34.0)
MCHC: 34 g/dL (ref 30.0–36.0)
Monocytes Absolute: 0.6 10*3/uL (ref 0.1–1.0)
Neutro Abs: 3.4 10*3/uL (ref 1.7–7.7)
Neutrophils Relative %: 64 % (ref 43–77)
RDW: 13.2 % (ref 11.5–15.5)

## 2013-08-13 LAB — COMPREHENSIVE METABOLIC PANEL
AST: 21 U/L (ref 0–37)
Albumin: 3.4 g/dL — ABNORMAL LOW (ref 3.5–5.2)
BUN: 42 mg/dL — ABNORMAL HIGH (ref 6–23)
CO2: 27 mEq/L (ref 19–32)
Calcium: 10.1 mg/dL (ref 8.4–10.5)
Chloride: 107 mEq/L (ref 96–112)
Creatinine, Ser: 2.15 mg/dL — ABNORMAL HIGH (ref 0.50–1.10)
GFR calc non Af Amer: 24 mL/min — ABNORMAL LOW (ref >90)
Potassium: 4.3 mEq/L (ref 3.5–5.1)
Total Bilirubin: 0.3 mg/dL (ref 0.3–1.2)
Total Protein: 6.8 g/dL (ref 6.0–8.3)

## 2013-08-13 LAB — PROTIME-INR
INR: 0.92 (ref 0.00–1.49)
Prothrombin Time: 12.2 seconds (ref 11.6–15.2)

## 2013-08-13 NOTE — Progress Notes (Signed)
cmet results routed to dr rosenbower inbasket by epic 

## 2013-08-15 NOTE — Anesthesia Preprocedure Evaluation (Addendum)
Anesthesia Evaluation  Patient identified by MRN, date of birth, ID band Patient awake    Reviewed: Allergy & Precautions, H&P , NPO status , Patient's Chart, lab work & pertinent test results  Airway       Dental  (+) Dental Advisory Given   Pulmonary neg pulmonary ROS,          Cardiovascular hypertension, Pt. on medications     Neuro/Psych CVA, No Residual Symptoms negative psych ROS   GI/Hepatic negative GI ROS, Neg liver ROS,   Endo/Other  negative endocrine ROS  Renal/GU Renal Insufficiency and CRFRenal disease     Musculoskeletal negative musculoskeletal ROS (+)   Abdominal   Peds  Hematology negative hematology ROS (+)   Anesthesia Other Findings   Reproductive/Obstetrics negative OB ROS                           Anesthesia Physical Anesthesia Plan  ASA: III  Anesthesia Plan: General   Post-op Pain Management:    Induction: Intravenous  Airway Management Planned: Oral ETT  Additional Equipment:   Intra-op Plan:   Post-operative Plan: Extubation in OR  Informed Consent: I have reviewed the patients History and Physical, chart, labs and discussed the procedure including the risks, benefits and alternatives for the proposed anesthesia with the patient or authorized representative who has indicated his/her understanding and acceptance.   Dental advisory given  Plan Discussed with: CRNA  Anesthesia Plan Comments:         Anesthesia Quick Evaluation

## 2013-08-16 ENCOUNTER — Encounter (HOSPITAL_COMMUNITY): Payer: Self-pay | Admitting: *Deleted

## 2013-08-16 ENCOUNTER — Encounter (HOSPITAL_COMMUNITY)
Admission: RE | Disposition: A | Discharge status: Home / Self Care | Payer: Self-pay | Source: Ambulatory Visit | Attending: General Surgery

## 2013-08-16 ENCOUNTER — Inpatient Hospital Stay (HOSPITAL_COMMUNITY)
Admission: RE | Admit: 2013-08-16 | Discharge: 2013-08-21 | DRG: 330 | Disposition: A | Discharge status: Home / Self Care | Payer: Commercial Managed Care - PPO | Source: Ambulatory Visit | Attending: General Surgery | Admitting: General Surgery

## 2013-08-16 ENCOUNTER — Encounter (HOSPITAL_COMMUNITY): Payer: Commercial Managed Care - PPO | Admitting: Anesthesiology

## 2013-08-16 ENCOUNTER — Inpatient Hospital Stay (HOSPITAL_COMMUNITY): Payer: Commercial Managed Care - PPO | Admitting: Anesthesiology

## 2013-08-16 DIAGNOSIS — D62 Acute posthemorrhagic anemia: Secondary | ICD-10-CM | POA: Diagnosis not present

## 2013-08-16 DIAGNOSIS — Z79899 Other long term (current) drug therapy: Secondary | ICD-10-CM

## 2013-08-16 DIAGNOSIS — E785 Hyperlipidemia, unspecified: Secondary | ICD-10-CM | POA: Diagnosis present

## 2013-08-16 DIAGNOSIS — Z23 Encounter for immunization: Secondary | ICD-10-CM | POA: Diagnosis not present

## 2013-08-16 DIAGNOSIS — N189 Chronic kidney disease, unspecified: Secondary | ICD-10-CM | POA: Diagnosis present

## 2013-08-16 DIAGNOSIS — Q613 Polycystic kidney, unspecified: Secondary | ICD-10-CM | POA: Diagnosis not present

## 2013-08-16 DIAGNOSIS — Z8673 Personal history of transient ischemic attack (TIA), and cerebral infarction without residual deficits: Secondary | ICD-10-CM

## 2013-08-16 DIAGNOSIS — N184 Chronic kidney disease, stage 4 (severe): Secondary | ICD-10-CM | POA: Diagnosis present

## 2013-08-16 DIAGNOSIS — K5732 Diverticulitis of large intestine without perforation or abscess without bleeding: Secondary | ICD-10-CM | POA: Diagnosis present

## 2013-08-16 DIAGNOSIS — I129 Hypertensive chronic kidney disease with stage 1 through stage 4 chronic kidney disease, or unspecified chronic kidney disease: Secondary | ICD-10-CM | POA: Diagnosis present

## 2013-08-16 HISTORY — PX: LAPAROSCOPIC PARTIAL COLECTOMY: SHX5907

## 2013-08-16 LAB — TYPE AND SCREEN
ABO/RH(D): O NEG
Antibody Screen: NEGATIVE

## 2013-08-16 LAB — ABO/RH: ABO/RH(D): O NEG

## 2013-08-16 SURGERY — LAPAROSCOPIC PARTIAL COLECTOMY
Anesthesia: General | Site: Abdomen | Wound class: Clean Contaminated

## 2013-08-16 MED ORDER — ONDANSETRON HCL 4 MG/2ML IJ SOLN: 4.0000 mg | Freq: Four times a day (QID) | INTRAMUSCULAR | Status: DC | PRN

## 2013-08-16 MED ORDER — ALVIMOPAN 12 MG PO CAPS: 12.0000 mg | ORAL_CAPSULE | Freq: Two times a day (BID) | ORAL | Status: DC

## 2013-08-16 MED ORDER — SUFENTANIL CITRATE 50 MCG/ML IV SOLN
INTRAVENOUS | Status: DC | PRN
  Administered 2013-08-16: 15 ug via INTRAVENOUS
  Administered 2013-08-16 (×2): 10 ug via INTRAVENOUS

## 2013-08-16 MED ORDER — BUPIVACAINE HCL (PF) 0.5 % IJ SOLN
INTRAMUSCULAR | Status: DC | PRN
  Administered 2013-08-16: 15 mL

## 2013-08-16 MED ORDER — LACTATED RINGERS IR SOLN
Status: DC | PRN
  Administered 2013-08-16: 1000 mL

## 2013-08-16 MED ORDER — 0.9 % SODIUM CHLORIDE (POUR BTL) OPTIME
TOPICAL | Status: DC | PRN
  Administered 2013-08-16: 2000 mL

## 2013-08-16 MED ORDER — AMLODIPINE BESYLATE 5 MG PO TABS
5.0000 mg | ORAL_TABLET | Freq: Every evening | ORAL | Status: DC
  Administered 2013-08-16 – 2013-08-20 (×5): 5 mg via ORAL
  Filled 2013-08-16 (×6): qty 1

## 2013-08-16 MED ORDER — LACTATED RINGERS IV SOLN: INTRAVENOUS | Status: DC

## 2013-08-16 MED ORDER — GLYCOPYRROLATE 0.2 MG/ML IJ SOLN
INTRAMUSCULAR | Status: DC | PRN
  Administered 2013-08-16: .5 mg via INTRAVENOUS

## 2013-08-16 MED ORDER — NALOXONE HCL 0.4 MG/ML IJ SOLN: 0.4000 mg | INTRAMUSCULAR | Status: DC | PRN

## 2013-08-16 MED ORDER — OXYCODONE HCL 5 MG PO TABS
5.0000 mg | ORAL_TABLET | Freq: Once | ORAL | Status: DC | PRN
Start: 2013-08-16 — End: 2013-08-16

## 2013-08-16 MED ORDER — HEPARIN SODIUM (PORCINE) 5000 UNIT/ML IJ SOLN
5000.0000 [IU] | Freq: Once | INTRAMUSCULAR | Status: AC
  Administered 2013-08-16: 5000 [IU] via SUBCUTANEOUS
  Filled 2013-08-16: qty 1

## 2013-08-16 MED ORDER — HYDROMORPHONE HCL PF 1 MG/ML IJ SOLN
INTRAMUSCULAR | Status: DC | PRN
  Administered 2013-08-16 (×2): 1 mg via INTRAVENOUS

## 2013-08-16 MED ORDER — DEXTROSE IN LACTATED RINGERS 5 % IV SOLN
INTRAVENOUS | Status: DC
  Administered 2013-08-16 – 2013-08-17 (×2): via INTRAVENOUS

## 2013-08-16 MED ORDER — PROMETHAZINE HCL 25 MG/ML IJ SOLN: 6.2500 mg | INTRAMUSCULAR | Status: DC | PRN

## 2013-08-16 MED ORDER — DEXAMETHASONE SODIUM PHOSPHATE 10 MG/ML IJ SOLN
INTRAMUSCULAR | Status: DC | PRN
  Administered 2013-08-16: 10 mg via INTRAVENOUS

## 2013-08-16 MED ORDER — SODIUM CHLORIDE 0.9 % IJ SOLN: 9.0000 mL | INTRAMUSCULAR | Status: DC | PRN

## 2013-08-16 MED ORDER — HYDROMORPHONE HCL PF 1 MG/ML IJ SOLN
INTRAMUSCULAR | Status: AC
  Filled 2013-08-16: qty 1

## 2013-08-16 MED ORDER — MIDAZOLAM HCL 5 MG/5ML IJ SOLN
INTRAMUSCULAR | Status: DC | PRN
  Administered 2013-08-16: 2 mg via INTRAVENOUS

## 2013-08-16 MED ORDER — ONDANSETRON HCL 4 MG/2ML IJ SOLN
INTRAMUSCULAR | Status: DC | PRN
  Administered 2013-08-16: 4 mg via INTRAMUSCULAR

## 2013-08-16 MED ORDER — OXYCODONE HCL 5 MG/5ML PO SOLN
5.0000 mg | Freq: Once | ORAL | Status: DC | PRN
  Filled 2013-08-16: qty 5

## 2013-08-16 MED ORDER — SODIUM CHLORIDE 0.9 % IV SOLN
INTRAVENOUS | Status: DC | PRN
  Administered 2013-08-16 (×2): via INTRAVENOUS

## 2013-08-16 MED ORDER — ONDANSETRON HCL 4 MG PO TABS: 4.0000 mg | ORAL_TABLET | Freq: Four times a day (QID) | ORAL | Status: DC | PRN

## 2013-08-16 MED ORDER — DEXTROSE 5 % IV SOLN
INTRAVENOUS | Status: AC
  Filled 2013-08-16 (×2): qty 1

## 2013-08-16 MED ORDER — MEPERIDINE HCL 50 MG/ML IJ SOLN: 6.2500 mg | INTRAMUSCULAR | Status: DC | PRN

## 2013-08-16 MED ORDER — DEXTROSE 5 % IV SOLN
2.0000 g | INTRAVENOUS | Status: AC
  Administered 2013-08-16 (×2): 2 g via INTRAVENOUS
  Filled 2013-08-16: qty 2

## 2013-08-16 MED ORDER — IRBESARTAN 75 MG PO TABS
75.0000 mg | ORAL_TABLET | Freq: Every day | ORAL | Status: DC
  Administered 2013-08-16 – 2013-08-18 (×3): 75 mg via ORAL
  Filled 2013-08-16 (×4): qty 1

## 2013-08-16 MED ORDER — DEXTROSE 5 % IV SOLN
2.0000 g | Freq: Two times a day (BID) | INTRAVENOUS | Status: AC
  Administered 2013-08-16: 2 g via INTRAVENOUS
  Filled 2013-08-16: qty 2

## 2013-08-16 MED ORDER — NEOSTIGMINE METHYLSULFATE 1 MG/ML IJ SOLN
INTRAMUSCULAR | Status: DC | PRN
  Administered 2013-08-16: 35 mg via INTRAVENOUS

## 2013-08-16 MED ORDER — INFLUENZA VAC SPLIT QUAD 0.5 ML IM SUSP
0.5000 mL | INTRAMUSCULAR | Status: AC
  Administered 2013-08-18: 0.5 mL via INTRAMUSCULAR
  Filled 2013-08-16 (×2): qty 0.5

## 2013-08-16 MED ORDER — PANTOPRAZOLE SODIUM 40 MG IV SOLR
40.0000 mg | INTRAVENOUS | Status: DC
  Administered 2013-08-16 – 2013-08-19 (×4): 40 mg via INTRAVENOUS
  Filled 2013-08-16 (×5): qty 40

## 2013-08-16 MED ORDER — LACTATED RINGERS IV SOLN
INTRAVENOUS | Status: DC | PRN
  Administered 2013-08-16: 07:00:00 via INTRAVENOUS

## 2013-08-16 MED ORDER — BUPIVACAINE HCL (PF) 0.5 % IJ SOLN
INTRAMUSCULAR | Status: AC
  Filled 2013-08-16: qty 30

## 2013-08-16 MED ORDER — MORPHINE SULFATE (PF) 1 MG/ML IV SOLN
INTRAVENOUS | Status: DC
  Administered 2013-08-16: 3 mg via INTRAVENOUS
  Administered 2013-08-16: 11:00:00 via INTRAVENOUS
  Administered 2013-08-16: 10.5 mg via INTRAVENOUS
  Administered 2013-08-17: 1.5 mg via INTRAVENOUS
  Administered 2013-08-17: 10.5 mg via INTRAVENOUS
  Administered 2013-08-17: 1.5 mg via INTRAVENOUS
  Administered 2013-08-17: 6 mg via INTRAVENOUS
  Administered 2013-08-17 (×2): 4.5 mg via INTRAVENOUS
  Administered 2013-08-17: 1.5 mg via INTRAVENOUS
  Administered 2013-08-18: 3 mg via INTRAVENOUS
  Administered 2013-08-18 (×4): 1.5 mg via INTRAVENOUS
  Administered 2013-08-18: 31.5 mg via INTRAVENOUS
  Administered 2013-08-19: 1.5 mg via INTRAVENOUS
  Administered 2013-08-19: 3 mg via INTRAVENOUS
  Administered 2013-08-19: 10 mg via INTRAVENOUS
  Administered 2013-08-19 – 2013-08-20 (×3): 1.5 mg via INTRAVENOUS
  Filled 2013-08-16: qty 25

## 2013-08-16 MED ORDER — MORPHINE SULFATE (PF) 1 MG/ML IV SOLN
INTRAVENOUS | Status: AC
  Filled 2013-08-16: qty 25

## 2013-08-16 MED ORDER — ROCURONIUM BROMIDE 100 MG/10ML IV SOLN
INTRAVENOUS | Status: DC | PRN
  Administered 2013-08-16: 5 mg via INTRAVENOUS
  Administered 2013-08-16: 45 mg via INTRAVENOUS

## 2013-08-16 MED ORDER — PROPOFOL 10 MG/ML IV BOLUS
INTRAVENOUS | Status: DC | PRN
  Administered 2013-08-16: 50 mg via INTRAVENOUS
  Administered 2013-08-16: 200 mg via INTRAVENOUS

## 2013-08-16 MED ORDER — SUCCINYLCHOLINE CHLORIDE 20 MG/ML IJ SOLN
INTRAMUSCULAR | Status: DC | PRN
  Administered 2013-08-16: 100 mg via INTRAVENOUS

## 2013-08-16 MED ORDER — DEXTROSE 5 % IV SOLN
INTRAVENOUS | Status: AC
  Filled 2013-08-16: qty 1

## 2013-08-16 MED ORDER — LIDOCAINE HCL (CARDIAC) 20 MG/ML IV SOLN
INTRAVENOUS | Status: DC | PRN
  Administered 2013-08-16: 75 mg via INTRAVENOUS

## 2013-08-16 MED ORDER — PHENYLEPHRINE HCL 10 MG/ML IJ SOLN
INTRAMUSCULAR | Status: DC | PRN
  Administered 2013-08-16: 80 ug via INTRAVENOUS
  Administered 2013-08-16: 40 ug via INTRAVENOUS

## 2013-08-16 MED ORDER — HEPARIN SODIUM (PORCINE) 5000 UNIT/ML IJ SOLN
5000.0000 [IU] | Freq: Three times a day (TID) | INTRAMUSCULAR | Status: DC
  Administered 2013-08-17 – 2013-08-21 (×13): 5000 [IU] via SUBCUTANEOUS
  Filled 2013-08-16 (×16): qty 1

## 2013-08-16 MED ORDER — DIPHENHYDRAMINE HCL 12.5 MG/5ML PO ELIX: 12.5000 mg | ORAL_SOLUTION | Freq: Four times a day (QID) | ORAL | Status: DC | PRN

## 2013-08-16 MED ORDER — HYDROMORPHONE HCL PF 1 MG/ML IJ SOLN
0.2500 mg | INTRAMUSCULAR | Status: DC | PRN
  Administered 2013-08-16 (×2): 0.25 mg via INTRAVENOUS

## 2013-08-16 MED ORDER — DIPHENHYDRAMINE HCL 50 MG/ML IJ SOLN: 12.5000 mg | Freq: Four times a day (QID) | INTRAMUSCULAR | Status: DC | PRN

## 2013-08-16 SURGICAL SUPPLY — 72 items
APPLIER CLIP 5 13 M/L LIGAMAX5 (MISCELLANEOUS)
APPLIER CLIP ROT 10 11.4 M/L (STAPLE)
BLADE EXTENDED COATED 6.5IN (ELECTRODE) IMPLANT
BLADE HEX COATED 2.75 (ELECTRODE) ×4 IMPLANT
BLADE SURG SZ10 CARB STEEL (BLADE) ×2 IMPLANT
CABLE HIGH FREQUENCY MONO STRZ (ELECTRODE) ×2 IMPLANT
CANISTER SUCTION 2500CC (MISCELLANEOUS) ×2 IMPLANT
CELLS DAT CNTRL 66122 CELL SVR (MISCELLANEOUS) IMPLANT
CLIP APPLIE 5 13 M/L LIGAMAX5 (MISCELLANEOUS) IMPLANT
CLIP APPLIE ROT 10 11.4 M/L (STAPLE) IMPLANT
CLOTH BEACON ORANGE TIMEOUT ST (SAFETY) ×2 IMPLANT
COVER MAYO STAND STRL (DRAPES) ×4 IMPLANT
DECANTER SPIKE VIAL GLASS SM (MISCELLANEOUS) ×2 IMPLANT
DISSECTOR BLUNT TIP ENDO 5MM (MISCELLANEOUS) IMPLANT
DRAIN CHANNEL 19F RND (DRAIN) IMPLANT
DRAPE LAPAROSCOPIC ABDOMINAL (DRAPES) ×2 IMPLANT
DRAPE LG THREE QUARTER DISP (DRAPES) ×2 IMPLANT
DRAPE UTILITY XL STRL (DRAPES) ×4 IMPLANT
DRAPE WARM FLUID 44X44 (DRAPE) ×2 IMPLANT
DRSG OPSITE POSTOP 4X6 (GAUZE/BANDAGES/DRESSINGS) ×2 IMPLANT
ELECT REM PT RETURN 9FT ADLT (ELECTROSURGICAL) ×2
ELECTRODE REM PT RTRN 9FT ADLT (ELECTROSURGICAL) ×1 IMPLANT
EVACUATOR SILICONE 100CC (DRAIN) IMPLANT
FILTER SMOKE EVAC LAPAROSHD (FILTER) IMPLANT
GLOVE ECLIPSE 8.0 STRL XLNG CF (GLOVE) ×4 IMPLANT
GLOVE INDICATOR 8.0 STRL GRN (GLOVE) ×4 IMPLANT
GOWN STRL REIN XL XLG (GOWN DISPOSABLE) ×8 IMPLANT
KIT BASIN OR (CUSTOM PROCEDURE TRAY) ×2 IMPLANT
LEGGING LITHOTOMY PAIR STRL (DRAPES) ×2 IMPLANT
LIGASURE IMPACT 36 18CM CVD LR (INSTRUMENTS) ×2 IMPLANT
NS IRRIG 1000ML POUR BTL (IV SOLUTION) ×4 IMPLANT
PENCIL BUTTON HOLSTER BLD 10FT (ELECTRODE) ×4 IMPLANT
RELOAD PROXIMATE 75MM BLUE (ENDOMECHANICALS) ×4 IMPLANT
RTRCTR WOUND ALEXIS 18CM MED (MISCELLANEOUS)
SCALPEL HARMONIC ACE (MISCELLANEOUS) IMPLANT
SCISSORS LAP 5X35 DISP (ENDOMECHANICALS) ×2 IMPLANT
SET IRRIG TUBING LAPAROSCOPIC (IRRIGATION / IRRIGATOR) IMPLANT
SLEEVE XCEL OPT CAN 5 100 (ENDOMECHANICALS) ×4 IMPLANT
SOLUTION ANTI FOG 6CC (MISCELLANEOUS) ×2 IMPLANT
SPONGE GAUZE 4X4 12PLY (GAUZE/BANDAGES/DRESSINGS) ×2 IMPLANT
SPONGE LAP 18X18 X RAY DECT (DISPOSABLE) ×4 IMPLANT
STAPLER PROXIMATE 75MM BLUE (STAPLE) ×2 IMPLANT
STAPLER VISISTAT 35W (STAPLE) ×2 IMPLANT
SUCTION POOLE TIP (SUCTIONS) ×2 IMPLANT
SUT ETHILON 2 0 PS N (SUTURE) IMPLANT
SUT MNCRL AB 4-0 PS2 18 (SUTURE) ×2 IMPLANT
SUT PDS AB 1 CTX 36 (SUTURE) IMPLANT
SUT PDS AB 1 TP1 96 (SUTURE) ×4 IMPLANT
SUT PROLENE 2 0 KS (SUTURE) IMPLANT
SUT PROLENE 2 0 SH DA (SUTURE) IMPLANT
SUT SILK 2 0 (SUTURE) ×1
SUT SILK 2 0 SH CR/8 (SUTURE) ×2 IMPLANT
SUT SILK 2-0 18XBRD TIE 12 (SUTURE) ×1 IMPLANT
SUT SILK 3 0 (SUTURE) ×1
SUT SILK 3 0 SH CR/8 (SUTURE) ×6 IMPLANT
SUT SILK 3-0 18XBRD TIE 12 (SUTURE) ×1 IMPLANT
SUT VIC AB 3-0 SH 27 (SUTURE) ×1
SUT VIC AB 3-0 SH 27X BRD (SUTURE) ×1 IMPLANT
SUT VICRYL 2 0 18  UND BR (SUTURE) ×1
SUT VICRYL 2 0 18 UND BR (SUTURE) ×1 IMPLANT
SYR BULB IRRIGATION 50ML (SYRINGE) ×2 IMPLANT
SYS LAPSCP GELPORT 120MM (MISCELLANEOUS)
SYSTEM LAPSCP GELPORT 120MM (MISCELLANEOUS) IMPLANT
TOWEL OR 17X26 10 PK STRL BLUE (TOWEL DISPOSABLE) ×4 IMPLANT
TOWEL OR NON WOVEN STRL DISP B (DISPOSABLE) ×4 IMPLANT
TRAY FOLEY CATH 14FRSI W/METER (CATHETERS) ×2 IMPLANT
TRAY LAP CHOLE (CUSTOM PROCEDURE TRAY) ×2 IMPLANT
TROCAR BLADELESS OPT 5 100 (ENDOMECHANICALS) ×2 IMPLANT
TROCAR XCEL BLUNT TIP 100MML (ENDOMECHANICALS) IMPLANT
TROCAR XCEL NON-BLD 11X100MML (ENDOMECHANICALS) IMPLANT
TUBING INSUFFLATION 10FT LAP (TUBING) ×2 IMPLANT
YANKAUER SUCT BULB TIP NO VENT (SUCTIONS) ×4 IMPLANT

## 2013-08-16 NOTE — Anesthesia Postprocedure Evaluation (Signed)
Anesthesia Post Note  Patient: Linda Gilmore  Procedure(s) Performed: Procedure(s) (LRB): LAPAROSCOPIC ASSISTED PARTIAL COLECTOMY (N/A)  Anesthesia type: General  Patient location: PACU  Post pain: Pain level controlled  Post assessment: Post-op Vital signs reviewed  Last Vitals: BP 122/64  Pulse 64  Temp(Src) 36.8 C (Oral)  Resp 12  Ht 5\' 7"  (1.702 m)  Wt 181 lb (82.101 kg)  BMI 28.34 kg/m2  SpO2 97%  LMP 06/01/2012  Post vital signs: Reviewed  Level of consciousness: sedated  Complications: No apparent anesthesia complications

## 2013-08-16 NOTE — Interval H&P Note (Signed)
History and Physical Interval Note:  08/16/2013 7:31 AM  Massa C Vanlanen  has presented today for surgery, with the diagnosis of sigmoid diverticulitis  The various methods of treatment have been discussed with the patient and family. After consideration of risks, benefits and other options for treatment, the patient has consented to  Procedure(s): LAPAROSCOPIC ASSISTED PARTIAL COLECTOMY (N/A) as a surgical intervention .  The patient's history has been reviewed, patient examined, no change in status, stable for surgery.  I have reviewed the patient's chart and labs.  Questions were answered to the patient's satisfaction.     Braidan Ricciardi Shela [facility]

## 2013-08-16 NOTE — Transfer of Care (Signed)
Immediate Anesthesia Transfer of Care Note  Patient: Linda Gilmore  Procedure(s) Performed: Procedure(s): LAPAROSCOPIC ASSISTED PARTIAL COLECTOMY (N/A)  Patient Location: PACU  Anesthesia Type:General  Level of Consciousness: awake, alert , oriented and patient cooperative  Airway & Oxygen Therapy: Patient Spontanous Breathing and Patient connected to face mask oxygen  Post-op Assessment: Report given to PACU RN, Post -op Vital signs reviewed and stable and Patient moving all extremities X 4  Post vital signs: stable  Complications: No apparent anesthesia complications
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
## 2013-08-16 NOTE — Op Note (Signed)
Operative Note  Linda Gilmore female 57 y.o. 08/16/2013  PREOPERATIVE DX:  Recurrent sigmoid diverticulitis  POSTOPERATIVE DX:  Same  PROCEDURE:  Laparoscopic assisted partial colectomy (Left colon and sigmoid colon) with mobilization of splenic flexure         Surgeon: Adolph Pollack   Assistants: Emelia Loron M.D.  Anesthesia: General endotracheal anesthesia  Indications: This is a 57 year old female who has had 4 episodes of sigmoid diverticulitis over the past year. Colonoscopy demonstrates diverticular disease. She now presents for elective partial colectomy.    Procedure Detail:  She was brought to the operating room placed upon the operating table and a general anesthetic was given. She was placed in the lithotomy position. A Foley catheter was inserted. An oral gastric tube was inserted. The abdominal wall pelvis and perineal areas were sterilely prepped and draped.  A small 5 mm incision was made in the left upper quadrant. Using a 5 mm Optiview trocar and laparoscope access was gained into the peritoneal cavity and a pneumoperitoneum was created. Inspection of the trocar demonstrated no evidence of bleeding or organ injury. A 5 mm trocar was then placed in the midline superior to the umbilicus.  A 5 mm trocar was placed in the right lower quadrant. A 5 mm trocar was placed in the lower midline in the suprapubic region. Eventually, a 5 mm trocar was placed in the left lower quadrant.  The left colon and sigmoid colon were mobilized by dividing the lateral attachments and medializing the colon. An area of chronic inflammation was noted at the descending colon sigmoid colon junction consistent with previous CT evidence of diverticulitis. There was no abscess present.  The gonadal vessels and ureter were identified in the left side in the plane of dissection Anterior to these. I mobilized the rectosigmoid junction area and was able to bring it out of the pelvis.  Using  sharp dissection and electrocautery the splenic flexure was mobilized and mobilized the proximal half of the transverse colon as well by separating it from the omentum. Following this, I removed the suprapubic trocar and a made limited lower midline extraction incision through the skin, subcutaneous tissue, fascia, peritoneum. The wound protection device was placed. The left colon, sigmoid colon, and rectosigmoid junction were all able to be exteriorized. Using the GIA stapler, I divided the colon just below the rectosigmoid junction and at the distal 1/3 of the transverse colon.  The mesentery was divided with the LigaSure. The specimen (consisting of the left and sigmoid colon) was marked with a suture distally and handed off the field.  A side to side anastomosis was then performed between the transverse colon and the proximal rectum using the GIA stapler. The common defect was closed in 2 layers using a running 3-0 Vicryl suture followed by 3-0 silk interrupted Lembert sutures. A crotch stitch of 3-0 silk was placed. Anastomosis was patent, viable, under no tension, and showed no evidence of leak.  The anastomosis was dropped back into the abdominal cavity. Irrigation was performed of the abdominal cavity and the fluid evacuated. There is no evidence of organ injury or bleeding. The midline incision fascia was closed with running #1 double looped PDS suture.  Laparoscopy was performed in a 4 quadrant inspection demonstrated no evidence of bleeding or injury. The fascial closure was solid. The trocars were removed and the CO2 gas was released.  The skin of the lower midline incision was closed with staples. The skin of the trocar sites was closed  with 4-0 Monocryl subcuticular stitch.  Sterile dressings were applied.  She tolerated the procedure well without any apparent complications and was taken to the recovery in satisfactory condition.  Estimated Blood Loss:  300 mL         Drains:  none  Blood Given: none          Specimens: Left colon and sigmoid colon        Complications:  * No complications entered in OR log *         Disposition: PACU - hemodynamically stable.         Condition: stable

## 2013-08-17 ENCOUNTER — Encounter (HOSPITAL_COMMUNITY): Payer: Self-pay | Admitting: General Surgery

## 2013-08-17 DIAGNOSIS — N184 Chronic kidney disease, stage 4 (severe): Secondary | ICD-10-CM | POA: Insufficient documentation

## 2013-08-17 LAB — CBC
HCT: 29.6 % — ABNORMAL LOW (ref 36.0–46.0)
MCH: 30 pg (ref 26.0–34.0)
MCV: 89.7 fL (ref 78.0–100.0)
RBC: 3.3 MIL/uL — ABNORMAL LOW (ref 3.87–5.11)
RDW: 13.1 % (ref 11.5–15.5)
WBC: 8.7 10*3/uL (ref 4.0–10.5)

## 2013-08-17 LAB — BASIC METABOLIC PANEL
CO2: 22 mEq/L (ref 19–32)
Chloride: 109 mEq/L (ref 96–112)
Creatinine, Ser: 2.07 mg/dL — ABNORMAL HIGH (ref 0.50–1.10)
GFR calc Af Amer: 30 mL/min — ABNORMAL LOW (ref >90)
Glucose, Bld: 166 mg/dL — ABNORMAL HIGH (ref 70–99)
Potassium: 4.8 mEq/L (ref 3.5–5.1)

## 2013-08-17 MED ORDER — DEXTROSE-NACL 5-0.9 % IV SOLN
INTRAVENOUS | Status: DC
  Administered 2013-08-17 – 2013-08-20 (×6): via INTRAVENOUS

## 2013-08-17 NOTE — Progress Notes (Signed)
1 Day Post-Op  Subjective: A little sore.  Walked.  No nausea.   Objective: Vital signs in last 24 hours: Temp:  [97.3 F (36.3 C)-98.6 F (37 C)] 97.9 F (36.6 C) (10/17 0622) Pulse Rate:  [64-82] 72 (10/17 0622) Resp:  [9-20] 20 (10/17 0640) BP: (99-143)/(58-77) 116/73 mmHg (10/17 0622) SpO2:  [90 %-100 %] 99 % (10/17 0640) FiO2 (%):  [37 %-39 %] 37 % (10/17 0640) Weight:  [181 lb (82.101 kg)] 181 lb (82.101 kg) (10/16 0840) Last BM Date: 08/15/13  Intake/Output from previous day: 10/16 0701 - 10/17 0700 In: 3947.1 [P.O.:120; I.V.:3827.1] Out: 1900 [Urine:1800; Blood:100] Intake/Output this shift:    PE: General- In NAD Abdomen-soft, rare bowel sound, dressings dry  Lab Results:   Recent Labs  08/17/13 0402  WBC 8.7  HGB 9.9*  HCT 29.6*  PLT 169   BMET  Recent Labs  08/17/13 0402  NA 139  K 4.8  CL 109  CO2 22  GLUCOSE 166*  BUN 27*  CREATININE 2.07*  CALCIUM 9.2   PT/INR No results found for this basename: LABPROT, INR,  in the last 72 hours Comprehensive Metabolic Panel:    Component Value Date/Time   NA 139 08/17/2013 0402   K 4.8 08/17/2013 0402   CL 109 08/17/2013 0402   CO2 22 08/17/2013 0402   BUN 27* 08/17/2013 0402   CREATININE 2.07* 08/17/2013 0402   GLUCOSE 166* 08/17/2013 0402   CALCIUM 9.2 08/17/2013 0402   AST 21 08/13/2013 0935   ALT 19 08/13/2013 0935   ALKPHOS 54 08/13/2013 0935   BILITOT 0.3 08/13/2013 0935   PROT 6.8 08/13/2013 0935   ALBUMIN 3.4* 08/13/2013 0935     Studies/Results: No results found.  Anti-infectives: Anti-infectives   Start     Dose/Rate Route Frequency Ordered Stop   08/16/13 2200  cefoTEtan (CEFOTAN) 2 g in dextrose 5 % 50 mL IVPB     2 g 100 mL/hr over 30 Minutes Intravenous Every 12 hours 08/16/13 1332 08/16/13 2220   08/16/13 0528  cefOXitin (MEFOXIN) 2 g in dextrose 5 % 50 mL IVPB     2 g 100 mL/hr over 30 Minutes Intravenous On call to O.R. 08/16/13 0528 08/16/13 1115       Assessment Principal Problem:   Diverticulitis of colon (without mention of hemorrhage) s/p left colectomy Active Problems:   Chronic kidney disease (CKD)-Creatinine stable with baseline about 2-2.5.  ABL anemia-HD stable.    LOS: 1 day   Plan: Clear liquids.  Decrease IVF.  Check hemoglobin tomorrow.   Jaxxen Voong J 08/17/2013

## 2013-08-18 LAB — CBC
HCT: 28.3 % — ABNORMAL LOW (ref 36.0–46.0)
Hemoglobin: 9.2 g/dL — ABNORMAL LOW (ref 12.0–15.0)
MCH: 29.5 pg (ref 26.0–34.0)
MCHC: 32.5 g/dL (ref 30.0–36.0)
MCV: 90.7 fL (ref 78.0–100.0)
Platelets: 138 K/uL — ABNORMAL LOW (ref 150–400)
RBC: 3.12 MIL/uL — ABNORMAL LOW (ref 3.87–5.11)
RDW: 13.6 % (ref 11.5–15.5)
WBC: 7 K/uL (ref 4.0–10.5)

## 2013-08-18 LAB — BASIC METABOLIC PANEL WITH GFR
BUN: 24 mg/dL — ABNORMAL HIGH (ref 6–23)
CO2: 23 meq/L (ref 19–32)
Calcium: 9.1 mg/dL (ref 8.4–10.5)
Chloride: 109 meq/L (ref 96–112)
Creatinine, Ser: 1.85 mg/dL — ABNORMAL HIGH (ref 0.50–1.10)
GFR calc Af Amer: 34 mL/min — ABNORMAL LOW
GFR calc non Af Amer: 29 mL/min — ABNORMAL LOW
Glucose, Bld: 111 mg/dL — ABNORMAL HIGH (ref 70–99)
Potassium: 4.3 meq/L (ref 3.5–5.1)
Sodium: 138 meq/L (ref 135–145)

## 2013-08-18 NOTE — Progress Notes (Signed)
Patient ID: Linda Gilmore, female   DOB: Nov 03, 1955, 57 y.o.   MRN: [id number] [facility] Progress Note:   2 Days Post-Op  Subjective: Mental status is clear.  Up and walking about room.   Objective: Vital signs in last 24 hours: Temp:  [97.9 F (36.6 C)-98.3 F (36.8 C)] 98 F (36.7 C) (10/18 0800) Pulse Rate:  [66-72] 66 (10/18 0800) Resp:  [11-18] 14 (10/18 0800) BP: (121-128)/(55-76) 128/76 mmHg (10/18 0800) SpO2:  [98 %-100 %] 100 % (10/18 0800) FiO2 (%):  [35 %-36 %] 35 % (10/18 0357)  Intake/Output from previous day: 10/17 0701 - 10/18 0700 In: 2583.3 [P.O.:240; I.V.:2343.3] Out: 3350 [Urine:3350] Intake/Output this shift: Total I/O In: -  Out: 220 [Urine:220]  Physical Exam: Work of breathing is normal.  Taking liquids but no flatus.  Urged to slow po intake until flatus.    Lab Results:  Results for orders placed during the hospital encounter of 08/16/13 (from the past 48 hour(s))  BASIC METABOLIC PANEL     Status: Abnormal   Collection Time    08/17/13  4:02 AM      Result Value Range   Sodium 139  135 - 145 mEq/L   Potassium 4.8  3.5 - 5.1 mEq/L   Chloride 109  96 - 112 mEq/L   CO2 22  19 - 32 mEq/L   Glucose, Bld 166 (*) 70 - 99 mg/dL   BUN 27 (*) 6 - 23 mg/dL   Creatinine, Ser 4.09 (*) 0.50 - 1.10 mg/dL   Calcium 9.2  8.4 - 81.1 mg/dL   GFR calc non Af Amer 26 (*) >90 mL/min   GFR calc Af Amer 30 (*) >90 mL/min   Comment: (NOTE)     The eGFR has been calculated using the CKD EPI equation.     This calculation has not been validated in all clinical situations.     eGFR's persistently <90 mL/min signify possible Chronic Kidney     Disease.  CBC     Status: Abnormal   Collection Time    08/17/13  4:02 AM      Result Value Range   WBC 8.7  4.0 - 10.5 K/uL   RBC 3.30 (*) 3.87 - 5.11 MIL/uL   Hemoglobin 9.9 (*) 12.0 - 15.0 g/dL   HCT 91.4 (*) 78.2 - 95.6 %   MCV 89.7  78.0 - 100.0 fL   MCH 30.0  26.0 - 34.0 pg   MCHC 33.4  30.0 - 36.0  g/dL   RDW 21.3  08.6 - 57.8 %   Platelets 169  150 - 400 K/uL  CBC     Status: Abnormal   Collection Time    08/18/13  4:16 AM      Result Value Range   WBC 7.0  4.0 - 10.5 K/uL   RBC 3.12 (*) 3.87 - 5.11 MIL/uL   Hemoglobin 9.2 (*) 12.0 - 15.0 g/dL   HCT 46.9 (*) 62.9 - 52.8 %   MCV 90.7  78.0 - 100.0 fL   MCH 29.5  26.0 - 34.0 pg   MCHC 32.5  30.0 - 36.0 g/dL   RDW 41.3  24.4 - 01.0 %   Platelets 138 (*) 150 - 400 K/uL  BASIC METABOLIC PANEL     Status: Abnormal   Collection Time    08/18/13  4:16 AM      Result Value Range   Sodium 138  135 - 145 mEq/L  Potassium 4.3  3.5 - 5.1 mEq/L   Chloride 109  96 - 112 mEq/L   CO2 23  19 - 32 mEq/L   Glucose, Bld 111 (*) 70 - 99 mg/dL   BUN 24 (*) 6 - 23 mg/dL   Creatinine, Ser 4.09 (*) 0.50 - 1.10 mg/dL   Calcium 9.1  8.4 - 81.1 mg/dL   GFR calc non Af Amer 29 (*) >90 mL/min   GFR calc Af Amer 34 (*) >90 mL/min   Comment: (NOTE)     The eGFR has been calculated using the CKD EPI equation.     This calculation has not been validated in all clinical situations.     eGFR's persistently <90 mL/min signify possible Chronic Kidney     Disease.    Radiology/Results: No results found.  Anti-infectives: Anti-infectives   Start     Dose/Rate Route Frequency Ordered Stop   08/16/13 2200  cefoTEtan (CEFOTAN) 2 g in dextrose 5 % 50 mL IVPB     2 g 100 mL/hr over 30 Minutes Intravenous Every 12 hours 08/16/13 1332 08/16/13 2220   08/16/13 0528  cefOXitin (MEFOXIN) 2 g in dextrose 5 % 50 mL IVPB     2 g 100 mL/hr over 30 Minutes Intravenous On call to O.R. 08/16/13 0528 08/16/13 1115      Assessment/Plan: Problem List: Patient Active Problem List   Diagnosis Date Noted  . Chronic kidney disease (CKD) 08/17/2013  . Diverticulitis of colon (without mention of hemorrhage) s/p left colectomy 05/16/2013    Potassium OK.  Stable postop awaiting flatus before feeding.  2 Days Post-Op    LOS: 2 days   Matt B. Daphine Deutscher, MD,  [facility] [phone] beeper [phone]  08/18/2013 9:40 AM

## 2013-08-19 MED ORDER — IRBESARTAN 150 MG PO TABS
150.0000 mg | ORAL_TABLET | Freq: Every day | ORAL | Status: DC
  Administered 2013-08-19 – 2013-08-21 (×3): 150 mg via ORAL
  Filled 2013-08-19 (×3): qty 1

## 2013-08-19 NOTE — Progress Notes (Signed)
Patient ID: Linda Gilmore, female   DOB: 1956/09/02, 57 y.o.   MRN: [id number] [facility] Progress Note:   3 Days Post-Op  Subjective: Mental status is clear.  No complaints except that she didn't want Avapro but wanted back on her Diovan.  I attempted to switch this but was blocked by the pharmacy on the computer.   Objective: Vital signs in last 24 hours: Temp:  [98 F (36.7 C)-98.2 F (36.8 C)] 98 F (36.7 C) (10/19 0612) Pulse Rate:  [71-79] 79 (10/19 0612) Resp:  [12-18] 13 (10/19 0612) BP: (141-151)/(75-87) 151/87 mmHg (10/19 0612) SpO2:  [96 %-100 %] 100 % (10/19 0612)  Intake/Output from previous day: 10/18 0701 - 10/19 0700 In: 2613.3 [P.O.:240; I.V.:2373.3] Out: 3870 [Urine:3870] Intake/Output this shift: Total I/O In: 360 [P.O.:360] Out: 1500 [Urine:1300; Stool:200]  Physical Exam: Work of breathing is normal.  Passing stools.  Minimally sore  Lab Results:  Results for orders placed during the hospital encounter of 08/16/13 (from the past 48 hour(s))  CBC     Status: Abnormal   Collection Time    08/18/13  4:16 AM      Result Value Range   WBC 7.0  4.0 - 10.5 K/uL   RBC 3.12 (*) 3.87 - 5.11 MIL/uL   Hemoglobin 9.2 (*) 12.0 - 15.0 g/dL   HCT 47.8 (*) 29.5 - 62.1 %   MCV 90.7  78.0 - 100.0 fL   MCH 29.5  26.0 - 34.0 pg   MCHC 32.5  30.0 - 36.0 g/dL   RDW 30.8  65.7 - 84.6 %   Platelets 138 (*) 150 - 400 K/uL  BASIC METABOLIC PANEL     Status: Abnormal   Collection Time    08/18/13  4:16 AM      Result Value Range   Sodium 138  135 - 145 mEq/L   Potassium 4.3  3.5 - 5.1 mEq/L   Chloride 109  96 - 112 mEq/L   CO2 23  19 - 32 mEq/L   Glucose, Bld 111 (*) 70 - 99 mg/dL   BUN 24 (*) 6 - 23 mg/dL   Creatinine, Ser 9.62 (*) 0.50 - 1.10 mg/dL   Calcium 9.1  8.4 - 95.2 mg/dL   GFR calc non Af Amer 29 (*) >90 mL/min   GFR calc Af Amer 34 (*) >90 mL/min   Comment: (NOTE)     The eGFR has been calculated using the CKD EPI equation.     This  calculation has not been validated in all clinical situations.     eGFR's persistently <90 mL/min signify possible Chronic Kidney     Disease.    Radiology/Results: No results found.  Anti-infectives: Anti-infectives   Start     Dose/Rate Route Frequency Ordered Stop   08/16/13 2200  cefoTEtan (CEFOTAN) 2 g in dextrose 5 % 50 mL IVPB     2 g 100 mL/hr over 30 Minutes Intravenous Every 12 hours 08/16/13 1332 08/16/13 2220   08/16/13 0528  cefOXitin (MEFOXIN) 2 g in dextrose 5 % 50 mL IVPB     2 g 100 mL/hr over 30 Minutes Intravenous On call to O.R. 08/16/13 0528 08/16/13 1115      Assessment/Plan: Problem List: Patient Active Problem List   Diagnosis Date Noted  . Chronic kidney disease (CKD) 08/17/2013  . Diverticulitis of colon (without mention of hemorrhage) s/p left colectomy 05/16/2013    Ileus resolving from colectomy 3 Days Post-Op  LOS: 3 days   Matt B. Daphine Deutscher, MD, [facility] [phone] beeper [phone]  08/19/2013 10:09 AM

## 2013-08-20 MED ORDER — PANTOPRAZOLE SODIUM 40 MG PO TBEC
40.0000 mg | DELAYED_RELEASE_TABLET | Freq: Every day | ORAL | Status: DC
  Administered 2013-08-20 – 2013-08-21 (×2): 40 mg via ORAL
  Filled 2013-08-20 (×2): qty 1

## 2013-08-20 MED ORDER — OXYCODONE HCL 5 MG PO TABS
5.0000 mg | ORAL_TABLET | ORAL | Status: DC | PRN
  Administered 2013-08-20 – 2013-08-21 (×3): 5 mg via ORAL
  Filled 2013-08-20: qty 2
  Filled 2013-08-20 (×2): qty 1

## 2013-08-20 NOTE — Progress Notes (Signed)
4 Days Post-Op  Subjective: Tolerating full liquids.  Bowels moving.  Walking.  Objective: Vital signs in last 24 hours: Temp:  [97.9 F (36.6 C)-98.2 F (36.8 C)] 98.2 F (36.8 C) (10/20 0600) Pulse Rate:  [66-76] 66 (10/20 0600) Resp:  [12-20] 12 (10/20 0851) BP: (122-160)/(66-81) 122/80 mmHg (10/20 0600) SpO2:  [99 %-100 %] 100 % (10/20 0851) FiO2 (%):  [36 %] 36 % (10/19 1940) Last BM Date: 08/19/13  Intake/Output from previous day: 10/19 0701 - 10/20 0700 In: 2840 [P.O.:840; I.V.:2000] Out: 5800 [Urine:5400; Stool:400] Intake/Output this shift:    PE: General- In NAD Abdomen-soft, incisions are clean and intact.  Lab Results:   Recent Labs  08/18/13 0416  WBC 7.0  HGB 9.2*  HCT 28.3*  PLT 138*   BMET  Recent Labs  08/18/13 0416  NA 138  K 4.3  CL 109  CO2 23  GLUCOSE 111*  BUN 24*  CREATININE 1.85*  CALCIUM 9.1   PT/INR No results found for this basename: LABPROT, INR,  in the last 72 hours Comprehensive Metabolic Panel:    Component Value Date/Time   NA 138 08/18/2013 0416   K 4.3 08/18/2013 0416   CL 109 08/18/2013 0416   CO2 23 08/18/2013 0416   BUN 24* 08/18/2013 0416   CREATININE 1.85* 08/18/2013 0416   GLUCOSE 111* 08/18/2013 0416   CALCIUM 9.1 08/18/2013 0416   AST 21 08/13/2013 0935   ALT 19 08/13/2013 0935   ALKPHOS 54 08/13/2013 0935   BILITOT 0.3 08/13/2013 0935   PROT 6.8 08/13/2013 0935   ALBUMIN 3.4* 08/13/2013 0935     Studies/Results: No results found.  Anti-infectives: Anti-infectives   Start     Dose/Rate Route Frequency Ordered Stop   08/16/13 2200  cefoTEtan (CEFOTAN) 2 g in dextrose 5 % 50 mL IVPB     2 g 100 mL/hr over 30 Minutes Intravenous Every 12 hours 08/16/13 1332 08/16/13 2220   08/16/13 0528  cefOXitin (MEFOXIN) 2 g in dextrose 5 % 50 mL IVPB     2 g 100 mL/hr over 30 Minutes Intravenous On call to O.R. 08/16/13 0528 08/16/13 1115      Assessment Principal Problem:   Diverticulitis of colon  (without mention of hemorrhage) s/p left colectomy-ileus resolving. Active Problems:   Chronic kidney disease (CKD)-stable   ABL anemia-stable.    LOS: 4 days   Plan: Advance diet.  Oral analgesic.  Heplock IV.  Discharge instructions discussed with her.   Linda Gilmore 08/20/2013

## 2013-08-20 NOTE — Progress Notes (Signed)
PHARMACY BRIEF NOTE:  CONVERSION OF PROTONIX FROM IV TO PO  The patient is receiving Protonix by the intravenous route.  Based on criteria approved by the Pharmacy and Therapeutics Committee and the Medical Executive Committee, the medication is being converted to the equivalent oral dose form.  These criteria include: -No Active GI bleeding -Able to tolerate diet of full liquids (or better) or tube feeding -Able to tolerate other medications by the oral or enteral route  If you have any questions about this conversion, please contact the Pharmacy Department (phone [phone]).  Thank you.  Elie Goody, PharmD, BCPS Pager: [phone] 08/20/2013  12:49 PM

## 2013-08-21 MED ORDER — OXYCODONE HCL 5 MG PO TABS: 5.0000 mg | ORAL_TABLET | ORAL | Status: DC | PRN

## 2013-08-21 NOTE — Progress Notes (Signed)
5 Days Post-Op  Subjective: Feels good. Tolerating diet. Bowels moving.  Ambulating Objective: Vital signs in last 24 hours: Temp:  [98 F (36.7 C)-98.6 F (37 C)] 98.1 F (36.7 C) (10/21 0538) Pulse Rate:  [70-82] 75 (10/21 0538) Resp:  [16] 16 (10/21 0538) BP: (125-146)/(63-82) 125/81 mmHg (10/21 0538) SpO2:  [98 %] 98 % (10/21 0538) Last BM Date: 08/19/13  Intake/Output from previous day: 10/20 0701 - 10/21 0700 In: 960 [P.O.:960] Out: 4200 [Urine:4200] Intake/Output this shift:    PE: General- In NAD Abdomen-incisions are clean and intact  Lab Results:  No results found for this basename: WBC, HGB, HCT, PLT,  in the last 72 hours BMET No results found for this basename: NA, K, CL, CO2, GLUCOSE, BUN, CREATININE, CALCIUM,  in the last 72 hours PT/INR No results found for this basename: LABPROT, INR,  in the last 72 hours Comprehensive Metabolic Panel:    Component Value Date/Time   NA 138 08/18/2013 0416   K 4.3 08/18/2013 0416   CL 109 08/18/2013 0416   CO2 23 08/18/2013 0416   BUN 24* 08/18/2013 0416   CREATININE 1.85* 08/18/2013 0416   GLUCOSE 111* 08/18/2013 0416   CALCIUM 9.1 08/18/2013 0416   AST 21 08/13/2013 0935   ALT 19 08/13/2013 0935   ALKPHOS 54 08/13/2013 0935   BILITOT 0.3 08/13/2013 0935   PROT 6.8 08/13/2013 0935   ALBUMIN 3.4* 08/13/2013 0935     Studies/Results: No results found.  Anti-infectives: Anti-infectives   Start     Dose/Rate Route Frequency Ordered Stop   08/16/13 2200  cefoTEtan (CEFOTAN) 2 g in dextrose 5 % 50 mL IVPB     2 g 100 mL/hr over 30 Minutes Intravenous Every 12 hours 08/16/13 1332 08/16/13 2220   08/16/13 0528  cefOXitin (MEFOXIN) 2 g in dextrose 5 % 50 mL IVPB     2 g 100 mL/hr over 30 Minutes Intravenous On call to O.R. 08/16/13 0528 08/16/13 1115      Assessment Principal Problem:   Diverticulitis of colon (without mention of hemorrhage) s/p left colectomy-doing well; pathology demonstrates  diverticulosis and diverticulitis, no malignancy Active Problems:   Chronic kidney disease (CKD)-stable   ABL anemia-stable   LOS: 5 days   Plan: Discharge.  Instructions given.   Linda Gilmore 08/21/2013

## 2013-08-21 NOTE — Progress Notes (Signed)
08/21/2013 Colleen Can BSN RN CCM [phone] Pt discharged to home. There are no HH or DME needs.

## 2013-08-22 NOTE — Discharge Summary (Signed)
Physician Discharge Summary  Patient ID: Linda Gilmore MRN: [id number] DOB/AGE: 1956-09-14 57 y.o.  Admit date: 08/16/2013 Discharge date: 08/21/2013  Admission Diagnoses:  Recurrent sigmoid diverticulitis  Discharge Diagnoses:  Principal Problem:   Recurrent diverticulitis of colon (without mention of hemorrhage) s/p left colectomy Active Problems:   Chronic kidney disease (CKD)   Discharged Condition: good  Hospital Course: She underwent a laparoscopic-assisted left colectomy 08/16/2013.  She had a slow but steady postoperative progression. She started having return of bowel function. She was started on liquid diet was slowly advanced. Her PCA pump was discontinued on her fourth postoperative day and she was started on oral analgesics. On postoperative day 5 her wound was healing well, she was having bowel movements, she was tolerating her diet, she was ambulatory independently, and she was felt to be ready for discharge. Discharge instructions were discussed with her.  Consults: None  Significant Diagnostic Studies: None  Treatments: surgery: Laparoscopic-assisted left colectomy  Discharge Exam: Blood pressure 125/81, pulse 75, temperature 98.1 F (36.7 C), temperature source Oral, resp. rate 16, height 5\' 7"  (1.702 m), weight 181 lb (82.101 kg), last menstrual period 06/01/2012, SpO2 98.00%.   Disposition: 01-Home or Self Care   Future Appointments Provider Department Dept Phone   08/23/2013 2:00 PM [facility] Nurse [facility] [place] [phone]   08/08/2014 9:15 AM Annamaria Boots, MD [facility] [phone]       Medication List         amLODipine 5 MG tablet  Commonly known as:  NORVASC  Take 5 mg by mouth every evening.     atorvastatin 10 MG tablet  Commonly known as:  LIPITOR  Take 10 mg by mouth every evening.     B-complex with vitamin C tablet  Take 1 tablet by mouth daily.     CO Q 10 PO  Take 1 capsule by  mouth daily.     GLUCOSAMINE CHOND COMPLEX/MSM PO  Take 1 tablet by mouth 2 (two) times daily.     LEG CRAMP RELIEF PO  Take 2 tablets by mouth every evening.     LOVAZA 1 G capsule  Generic drug:  omega-3 acid ethyl esters  Take 2 g by mouth 3 (three) times daily.     magnesium gluconate 500 MG tablet  Commonly known as:  MAGONATE  Take 500 mg by mouth daily.     multivitamin with minerals Tabs tablet  Take 1 tablet by mouth daily.     oxyCODONE 5 MG immediate release tablet  Commonly known as:  Oxy IR/ROXICODONE  Take 1-2 tablets (5-10 mg total) by mouth every 4 (four) hours as needed.     PROBIOTIC DAILY Caps  Take 1 capsule by mouth daily.     tolvaptan 15 MG Tabs tablet  Commonly known as:  SAMSCA  Take 15-45 mg by mouth 2 (two) times daily. TRAIL DRUG FROM [place], WILL STOP AFTER 08-14-2013 PER DR Garrette Caine     valsartan 160 MG tablet  Commonly known as:  DIOVAN  Take 160 mg by mouth 2 (two) times daily.     Vitamin D (Ergocalciferol) 50000 UNITS Caps capsule  Commonly known as:  DRISDOL  Take 50,000 Units by mouth every 7 (seven) days. Takes on Sunday         Signed: Ceniya Fowers J 08/22/2013, 8:30 AM

## 2013-08-23 ENCOUNTER — Ambulatory Visit (INDEPENDENT_AMBULATORY_CARE_PROVIDER_SITE_OTHER): Payer: Commercial Managed Care - PPO

## 2013-08-23 DIAGNOSIS — Z4802 Encounter for removal of sutures: Secondary | ICD-10-CM

## 2013-08-23 NOTE — Progress Notes (Signed)
Pt is post op lap colectomy by Dr. Abbey Chatters on 08/16/13.  She is here today for staple removal.  Her wound is healing well with a minimal amount of bruising. She states she is only taking pain medication in the morning and before bed at night.  Staples were removal and steri strips placed over the incision.  Pt tolerated procedure very well.  Post op appointment was given.

## 2013-08-27 ENCOUNTER — Telehealth (INDEPENDENT_AMBULATORY_CARE_PROVIDER_SITE_OTHER): Payer: Self-pay | Admitting: General Surgery

## 2013-08-27 NOTE — Telephone Encounter (Signed)
Patient calling status post colectomy and staple removal on 08/23/2013. She is calling to see if it is okay for her to walk up/down stairs. I advised her to take it easy, but she could do that now. She will call with any additional questions.

## 2013-09-06 ENCOUNTER — Ambulatory Visit (INDEPENDENT_AMBULATORY_CARE_PROVIDER_SITE_OTHER): Payer: Commercial Managed Care - PPO | Admitting: General Surgery

## 2013-09-06 ENCOUNTER — Encounter (INDEPENDENT_AMBULATORY_CARE_PROVIDER_SITE_OTHER): Payer: Self-pay | Admitting: General Surgery

## 2013-09-06 VITALS — BP 136/80 | HR 92 | Temp 96.4°F | Resp 18 | Ht 67.0 in | Wt 175.0 lb

## 2013-09-06 DIAGNOSIS — Z4889 Encounter for other specified surgical aftercare: Secondary | ICD-10-CM

## 2013-09-06 NOTE — Progress Notes (Addendum)
Procedure:  Laparoscopic assisted sigmoid colectomy  Date:  08/16/2013  Pathology:  Diverticulosis and diverticulitis,  History:   She is here for her first postoperative visit and is doing well. She is tolerating a diet. Bowels are moving. No incisional complaints.  Exam: General- Is in NAD. Abdomen-soft, incisions are clean, intact, and solid.   Assessment:  Doing well postoperatively.  Plan:  Continue light activities. May return to desk work. Start high-fiber diet. Return visit 3 weeks.

## 2013-09-06 NOTE — Patient Instructions (Signed)
High fiber diet. Continue light activities. May return to desk work.

## 2013-09-25 ENCOUNTER — Ambulatory Visit (INDEPENDENT_AMBULATORY_CARE_PROVIDER_SITE_OTHER): Payer: Commercial Managed Care - PPO | Admitting: General Surgery

## 2013-09-25 ENCOUNTER — Encounter (INDEPENDENT_AMBULATORY_CARE_PROVIDER_SITE_OTHER): Payer: Self-pay | Admitting: General Surgery

## 2013-09-25 VITALS — BP 132/72 | HR 68 | Temp 98.0°F | Resp 18 | Ht 67.0 in | Wt 182.0 lb

## 2013-09-25 DIAGNOSIS — Z4889 Encounter for other specified surgical aftercare: Secondary | ICD-10-CM

## 2013-09-25 NOTE — Patient Instructions (Signed)
Resume normal activities as tolerated as discussed.

## 2013-09-25 NOTE — Progress Notes (Signed)
Procedure:  Laparoscopic assisted sigmoid colectomy  Date:  08/16/2013  Pathology:  Diverticulosis and diverticulitis,  History:   She is here for another postoperative visit. She's been doing well. Bowels are working. She's tolerating her diet. Minimal incisional discomfort. She is back at work.  Exam: General- Is in NAD. Abdomen-soft, incisions are clean, intact, and solid.   Assessment:  Continues to do well postoperatively.  Plan: Resume normal activities as tolerated. High fiber diet as we had discussed previously. Return visit as needed.

## 2013-10-03 ENCOUNTER — Encounter (INDEPENDENT_AMBULATORY_CARE_PROVIDER_SITE_OTHER): Payer: Self-pay | Admitting: General Surgery

## 2013-10-03 ENCOUNTER — Ambulatory Visit (INDEPENDENT_AMBULATORY_CARE_PROVIDER_SITE_OTHER): Payer: Commercial Managed Care - PPO | Admitting: General Surgery

## 2013-10-03 VITALS — BP 127/83 | HR 72 | Temp 97.8°F | Resp 18 | Ht 67.0 in | Wt 182.0 lb

## 2013-10-03 DIAGNOSIS — Z4889 Encounter for other specified surgical aftercare: Secondary | ICD-10-CM

## 2013-10-03 NOTE — Progress Notes (Signed)
She comes in today because she was very active over Thanksgiving and the Friday thereafter. She's develop some sharp right groin pain. Moist heat helps. No problem with fever or chills. She is eating well. No nausea. Her bowels are working.  Examination:  Gen.-she looks well and is no acute distress.  Abdomen-lower midline incision is clean and intact. She has some mild tenderness to the right side of this but no evidence of hernia or bulging.  Assessment:  Acute muscular strain.  Plan: Apply heat to the area as needed. Use Tylenol for discomfort. Light activities for the next 3-4 weeks. I told her this could take 6 weeks to improve. If she still having a problem at that time, I asked her to call back for an appointment.

## 2013-10-03 NOTE — Patient Instructions (Signed)
Go back to light activities for the rest of the month. May use heat on the area.  Take Tylenol as needed.  If not better in 6 weeks, call for a follow-up appointment.

## 2013-11-05 ENCOUNTER — Telehealth (INDEPENDENT_AMBULATORY_CARE_PROVIDER_SITE_OTHER): Payer: Self-pay

## 2013-11-05 NOTE — Telephone Encounter (Signed)
Pt is s/p lap colectomy 08/16/13 by Dr. Zella Richer.  She was last seen by him on 10/03/13.  At that time she was still having incisional and groin pain.  Instructions for rest and applying heat were given.  Pt calls today stating that she went out of town last week for [employer].  She did not drive.  She was not overly active. When she returned home on Friday she began having groin pain.  She stayed off her feet all day with a heating pad to the area and took Tylenol.  She says that did help.  She is pain free today, but is concerned that there may be something "going on".  She would like Dr. Zella Richer to order a CT scan.  Please advise.

## 2013-11-05 NOTE — Telephone Encounter (Deleted)
Pt is

## 2013-11-05 NOTE — Telephone Encounter (Signed)
Please order CT of abdomen and pelvis for "Right groin and incisional pain post left colectomy in 08/2012."

## 2013-11-07 ENCOUNTER — Other Ambulatory Visit (INDEPENDENT_AMBULATORY_CARE_PROVIDER_SITE_OTHER): Payer: Self-pay | Admitting: General Surgery

## 2013-11-07 DIAGNOSIS — R1030 Lower abdominal pain, unspecified: Secondary | ICD-10-CM

## 2013-11-07 DIAGNOSIS — L7682 Other postprocedural complications of skin and subcutaneous tissue: Secondary | ICD-10-CM

## 2013-11-09 ENCOUNTER — Ambulatory Visit
Admission: RE | Admit: 2013-11-09 | Discharge: 2013-11-09 | Disposition: A | Discharge status: Home / Self Care | Payer: 59 | Source: Ambulatory Visit | Attending: General Surgery | Admitting: General Surgery

## 2013-11-09 DIAGNOSIS — L7682 Other postprocedural complications of skin and subcutaneous tissue: Secondary | ICD-10-CM

## 2013-11-09 DIAGNOSIS — R1030 Lower abdominal pain, unspecified: Secondary | ICD-10-CM

## 2013-11-12 ENCOUNTER — Encounter (INDEPENDENT_AMBULATORY_CARE_PROVIDER_SITE_OTHER): Payer: Self-pay | Admitting: General Surgery

## 2013-11-12 ENCOUNTER — Telehealth (INDEPENDENT_AMBULATORY_CARE_PROVIDER_SITE_OTHER): Payer: Self-pay

## 2013-11-12 NOTE — Telephone Encounter (Signed)
Patient is asking for DR. Rosenbower to call her about her Pelvis CT

## 2013-11-12 NOTE — Progress Notes (Signed)
Patient ID: Linda Gilmore, female   DOB: [birth year], 57 y.o.   MRN: [id number] FINDINGS:  Lung bases show no acute findings. Heart size normal. No pericardial  or pleural effusion.  Numerous low-attenuation lesions are seen in the liver, measuring up  to 4.8 cm in the right hepatic lobe (image 24), as before. There is  a lesion in the dome of the liver, measuring 4.1 cm and 23  Hounsfield units (image 15), which is smaller than on 06/20/2012. At  that time, a low-attenuation lesion measuring at least 4.8 x 6.0 cm  was seen in the same location. Gallbladder is decompressed. Adrenal  glands are unremarkable.  Renal parenchyma is nearly completely replaced with low-attenuation  lesions, with associated thin peripheral calcification and calcified  internal septi. Findings are similar to 06/20/2012 but further  characterization is limited in the absence of IV contrast. No renal  stones or hydronephrosis.  Spleen, pancreas, stomach and small bowel are unremarkable.  Postoperative changes in the colon, in the region of the descending  and sigmoid junction. Fair amount of stool is seen in the colon.  A ring device is seen in the region of the cervix/vagina. Uterus and  ovaries are visualized. Ventral incisional scar in the anatomic  pelvis midline.  No pathologically enlarged lymph nodes. No free fluid. Scattered  atherosclerotic calcification of the arterial vasculature without  abdominal aortic aneurysm. No inguinal hernia. No worrisome lytic or  sclerotic lesions.  IMPRESSION:  1. No findings to explain the patient's right groin pain.  2. Suspect mild constipation.  3. Numerous low-attenuation lesions in the liver and kidneys,  consistent with the given history of polycystic kidney disease.  Probable mildly complex or hemorrhagic cyst in the dome of the  liver, incompletely characterized in the absence of IV contrast.    I called her a discussed the results of the CT scan.  There is no  finding on her scan to explain some of the intermittent right groin pain she has.  Some weeks she has no pain.  I told it is likely that the pain will improve over time.

## 2013-11-13 ENCOUNTER — Telehealth (INDEPENDENT_AMBULATORY_CARE_PROVIDER_SITE_OTHER): Payer: Self-pay

## 2013-11-13 NOTE — Telephone Encounter (Signed)
Called pt and gave her results.  She spoke to Dr. Zella Richer last night.

## 2014-08-08 ENCOUNTER — Encounter: Payer: Self-pay | Admitting: Obstetrics & Gynecology

## 2014-08-08 ENCOUNTER — Ambulatory Visit (INDEPENDENT_AMBULATORY_CARE_PROVIDER_SITE_OTHER): Payer: Commercial Managed Care - PPO | Admitting: Obstetrics & Gynecology

## 2014-08-08 VITALS — BP 116/80 | HR 68 | Resp 18 | Ht 66.75 in | Wt 192.0 lb

## 2014-08-08 DIAGNOSIS — D259 Leiomyoma of uterus, unspecified: Secondary | ICD-10-CM | POA: Insufficient documentation

## 2014-08-08 DIAGNOSIS — D251 Intramural leiomyoma of uterus: Secondary | ICD-10-CM

## 2014-08-08 DIAGNOSIS — Z0000 Encounter for general adult medical examination without abnormal findings: Secondary | ICD-10-CM

## 2014-08-08 DIAGNOSIS — Z01411 Encounter for gynecological examination (general) (routine) with abnormal findings: Secondary | ICD-10-CM

## 2014-08-08 DIAGNOSIS — Z124 Encounter for screening for malignant neoplasm of cervix: Secondary | ICD-10-CM

## 2014-08-08 LAB — POCT URINALYSIS DIPSTICK
Bilirubin, UA: NEGATIVE
Glucose, UA: NEGATIVE
Ketones, UA: NEGATIVE
pH, UA: 5
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
## 2014-08-09 LAB — IPS PAP TEST WITH REFLEX TO HPV

## 2014-09-02 ENCOUNTER — Encounter: Payer: Self-pay | Admitting: Obstetrics & Gynecology

## 2014-11-13 DIAGNOSIS — I10 Essential (primary) hypertension: Secondary | ICD-10-CM | POA: Insufficient documentation

## 2014-12-05 ENCOUNTER — Other Ambulatory Visit: Payer: Self-pay | Admitting: Gastroenterology

## 2014-12-05 DIAGNOSIS — R109 Unspecified abdominal pain: Secondary | ICD-10-CM

## 2014-12-11 ENCOUNTER — Ambulatory Visit
Admission: RE | Admit: 2014-12-11 | Discharge: 2014-12-11 | Disposition: A | Discharge status: Home / Self Care | Payer: Commercial Managed Care - PPO | Source: Ambulatory Visit | Attending: Gastroenterology | Admitting: Gastroenterology

## 2014-12-11 DIAGNOSIS — R109 Unspecified abdominal pain: Secondary | ICD-10-CM

## 2015-03-05 ENCOUNTER — Telehealth: Payer: Self-pay | Admitting: Obstetrics & Gynecology

## 2015-03-05 DIAGNOSIS — N950 Postmenopausal bleeding: Secondary | ICD-10-CM

## 2015-03-05 DIAGNOSIS — Z86018 Personal history of other benign neoplasm: Secondary | ICD-10-CM

## 2015-03-05 NOTE — Telephone Encounter (Signed)
Spoke with patient. Patient states that for 3 months she has been experiencing intermittent spotting. "Sometimes it is a light spotting and other times it is heavier with some clotting. I had some light bleeding yesterday but have not had any today." Has also been experiencing left lower quadrant pain. Was seen with her gastroenterologist and had a CT scan performed which she states was normal minus showing the uterine fibroid she has. "Last time I saw Dr.Miller it was smaller. I am not sure if that is what is causing this or not." States that left lower quadrant pain is a constant dull pain. Is currently a 5/10. Advised patient will need to be seen in office for further evaluation and possible ultrasound for recheck of fibroid. Appointment scheduled for tomorrow at 2pm with [place]. Patient is agreeable to date and time. Order for PUS and EMB entered just in case these are needed for appointment.  Routing to provider for final review. Patient agreeable to disposition. Patient aware MD will review message and nurse will return call with any additional instructions or change of disposition. Will close encounter.

## 2015-03-05 NOTE — Telephone Encounter (Signed)
Patient is returning a call to Kaitlyn. °

## 2015-03-05 NOTE — Telephone Encounter (Signed)
Patient calling requesting an appointment today with Dr. Sabra Heck for "unusual bleeding."

## 2015-03-05 NOTE — Telephone Encounter (Signed)
Left message to call Uziah Sorter at [phone]. 

## 2015-03-06 ENCOUNTER — Ambulatory Visit (INDEPENDENT_AMBULATORY_CARE_PROVIDER_SITE_OTHER): Payer: Commercial Managed Care - PPO | Admitting: Obstetrics & Gynecology

## 2015-03-06 VITALS — BP 124/70 | HR 68 | Resp 16 | Wt 189.0 lb

## 2015-03-06 DIAGNOSIS — N950 Postmenopausal bleeding: Secondary | ICD-10-CM | POA: Diagnosis not present

## 2015-03-06 DIAGNOSIS — I6350 Cerebral infarction due to unspecified occlusion or stenosis of unspecified cerebral artery: Secondary | ICD-10-CM

## 2015-03-06 DIAGNOSIS — N952 Postmenopausal atrophic vaginitis: Secondary | ICD-10-CM

## 2015-03-06 DIAGNOSIS — I639 Cerebral infarction, unspecified: Secondary | ICD-10-CM

## 2015-03-06 MED ORDER — ESTROGENS, CONJUGATED 0.625 MG/GM VA CREA: TOPICAL_CREAM | VAGINAL | Status: DC

## 2015-03-06 NOTE — Progress Notes (Signed)
Subjective:     Patient ID: Linda Gilmore, female   DOB: September 06, 1956, 59 y.o.   MRN: [id number]  HPI 59 yo G1P1 legally separated WF here for PMP bleeding/spotting.  Pt has noted light spotting with wiping only.  This has been going on for several months--maybe as far back as January.  Pt has been menopausal since 8/13.  Not sexually active so not sure if having any vaginal dryness issues.  Does have urinary incontinence issues due to uterine prolapse and cystocele.  Has used a pessary for years.  Removed for today.    Working on exercise and weight loss.    Pt has been approved for the renal transplant list at [facility].  This just happened last week so she is now officially on the list.  Reviewed labs from [facility] from 12/27/14.  Hb 10.4.  Platelet ct was 205. INR was 1.0.    Review of Systems  All other systems reviewed and are negative.      Objective:   Physical Exam  Constitutional: She appears well-developed and well-nourished.  Abdominal: Soft. Bowel sounds are normal.  Genitourinary: Uterus normal. There is no rash, tenderness or lesion on the right labia. There is no rash, tenderness or lesion on the left labia. Cervix exhibits friability. Right adnexum displays no mass, no tenderness and no fullness. Left adnexum displays no mass, no tenderness and no fullness. No bleeding (with placement of speculum due to atrophic changes.) in the vagina.  Lymphadenopathy:       Right: No inguinal adenopathy present.       Left: No inguinal adenopathy present.  Skin: Skin is warm and dry.  Psychiatric: She has a normal mood and affect.       Assessment:     PMP bleeding which appears to be due to vaginal atrophic changes. H/O uterine fibroids PCKD, now on transplant list at [facility] H/O uterine prolapse and cystocele     Plan:     Premarin vaginal cream 1/2 pv two to three times weekly.  Pt will try to leave out at night or continuously if can tolerate  symptoms.  Reviewed with pt risks including DVT, PE, stroke, MI, breast cancer but will only use short term with pt so feel these are very low.  Will plan to transition to Replens or coconut oil once there is improvement.  Pt will check with nephrologist at [facility] to make sure there is no contraindication to what is going on with her from renal standpoint.    Do not feel pt needs biopsy.  Will recheck 4 weeks.  If still having bleeding despite improved vaginal tissue will plan endometrial biopsy.

## 2015-03-09 ENCOUNTER — Encounter: Payer: Self-pay | Admitting: Obstetrics & Gynecology

## 2015-03-26 ENCOUNTER — Telehealth: Payer: Self-pay | Admitting: Obstetrics & Gynecology

## 2015-03-26 NOTE — Telephone Encounter (Signed)
Pt had to cancel appointment next week for recheck with Dr Sabra Heck because of work and would like nurse to call her to reschedule.

## 2015-03-26 NOTE — Telephone Encounter (Signed)
Left message to call Arshia Rondon at [phone]. 

## 2015-03-26 NOTE — Telephone Encounter (Signed)
Spoke with patient. Appointment for 4 week recheck rescheduled for 6/17 at 10am with Dr.Miller. Patient is agreeable to date and time.  Routing to provider for final review. Patient agreeable to disposition. Will close encounter.

## 2015-03-26 NOTE — Telephone Encounter (Signed)
Returning call. Phone number updated.

## 2015-04-04 ENCOUNTER — Ambulatory Visit: Payer: Commercial Managed Care - PPO | Admitting: Obstetrics & Gynecology

## 2015-04-18 ENCOUNTER — Ambulatory Visit (INDEPENDENT_AMBULATORY_CARE_PROVIDER_SITE_OTHER): Payer: Commercial Managed Care - PPO | Admitting: Obstetrics & Gynecology

## 2015-04-18 VITALS — BP 120/72 | HR 68 | Resp 16 | Wt 186.6 lb

## 2015-04-18 DIAGNOSIS — N950 Postmenopausal bleeding: Secondary | ICD-10-CM | POA: Diagnosis not present

## 2015-04-18 DIAGNOSIS — N812 Incomplete uterovaginal prolapse: Secondary | ICD-10-CM | POA: Diagnosis not present

## 2015-04-18 DIAGNOSIS — N898 Other specified noninflammatory disorders of vagina: Secondary | ICD-10-CM

## 2015-04-18 DIAGNOSIS — T8389XD Other specified complication of genitourinary prosthetic devices, implants and grafts, subsequent encounter: Secondary | ICD-10-CM

## 2015-04-18 DIAGNOSIS — N952 Postmenopausal atrophic vaginitis: Secondary | ICD-10-CM

## 2015-04-30 ENCOUNTER — Encounter: Payer: Self-pay | Admitting: Obstetrics & Gynecology

## 2015-04-30 DIAGNOSIS — N898 Other specified noninflammatory disorders of vagina: Secondary | ICD-10-CM | POA: Insufficient documentation

## 2015-04-30 DIAGNOSIS — N950 Postmenopausal bleeding: Secondary | ICD-10-CM | POA: Insufficient documentation

## 2015-04-30 DIAGNOSIS — N812 Incomplete uterovaginal prolapse: Secondary | ICD-10-CM | POA: Insufficient documentation

## 2015-04-30 DIAGNOSIS — T8389XA Other specified complication of genitourinary prosthetic devices, implants and grafts, initial encounter: Secondary | ICD-10-CM

## 2015-04-30 DIAGNOSIS — N952 Postmenopausal atrophic vaginitis: Secondary | ICD-10-CM | POA: Insufficient documentation

## 2015-04-30 NOTE — Progress Notes (Signed)
Subjective:     Patient ID: Linda Gilmore, female   DOB: [birth year], 59 y.o.   MRN: [id number]  HPI 59 yo G2P2 legally separated WF here for follow up.  Pt was experiencing vaginal bleeding for several months before being seen in May.  She does have significant prolapse and uses a pessary.  Vaginal atrophic findings with significant tissue changes noted at exam 03/06/15.  Pt did not have any cervical bleeding so I did not think and endometrial biopsy was necessary.    Pt started on vaginal estrogen cream and bleeding has improved significantly.  Still having some with removal of pessary.  Is trying to leave it out a little more but is just so uncomfortable with it out and also has some urinary issues when it is out.  On renal transplant list.  Sister getting kidney next week--has living donor.    Denies current urinary changes and/or bowel changes.  Review of Systems  All other systems reviewed and are negative.      Objective:   Physical Exam  Constitutional: She is oriented to person, place, and time. She appears well-developed and well-nourished.  Genitourinary: Uterus normal. There is no rash, lesion or injury on the right labia. There is no rash, tenderness, lesion or injury on the left labia. Cervix exhibits no motion tenderness and no discharge. Right adnexum displays no mass, no tenderness and no fullness. Left adnexum displays no mass, no tenderness and no fullness. There is bleeding (with atrophic tissue changes, however it is improved) in the vagina.  Grade 3 uterine prolapse with associated cystocele.  Pessary was already out for exam.  Pt removed.  Lymphadenopathy:       Right: No inguinal adenopathy present.       Left: No inguinal adenopathy present.  Neurological: She is alert and oriented to person, place, and time.  Psychiatric: She has a normal mood and affect.       Assessment:     Uterine prolapse Atrophic vaginal tissues PMP bleeding Pessary use  Adult PCKD,  on transplant list at [facility]:     Continue vaginal estrogen.  May take up to three months to really improve vaginal tissue.  Advised pt to not take pessary in and out every day if possible and to leave out for longest amount of time possible over weekends.  I would prefer if pessary was out for longer period of time--2 weeks, for example, but she states she just cannot handle the symptoms especially considering having to be in an out of a hospital while her sister is getting her transplant.    Pt knows to let me know if bleeding continues after next two months.  No rx needed right now.     About 15 minutes spent in face to face discussion regarding strategies to try and improve vaginal tissue while still using pessary due to significant symptoms pt experiences with pessary not in place.

## 2015-06-22 ENCOUNTER — Emergency Department (HOSPITAL_COMMUNITY): Payer: Commercial Managed Care - PPO

## 2015-06-22 ENCOUNTER — Encounter (HOSPITAL_COMMUNITY): Payer: Self-pay | Admitting: Emergency Medicine

## 2015-06-22 ENCOUNTER — Emergency Department (HOSPITAL_COMMUNITY)
Admission: EM | Admit: 2015-06-22 | Discharge: 2015-06-22 | Disposition: A | Discharge status: Home / Self Care | Payer: Commercial Managed Care - PPO | Attending: Emergency Medicine | Admitting: Emergency Medicine

## 2015-06-22 DIAGNOSIS — Z79899 Other long term (current) drug therapy: Secondary | ICD-10-CM | POA: Diagnosis not present

## 2015-06-22 DIAGNOSIS — Z8673 Personal history of transient ischemic attack (TIA), and cerebral infarction without residual deficits: Secondary | ICD-10-CM | POA: Insufficient documentation

## 2015-06-22 DIAGNOSIS — K5732 Diverticulitis of large intestine without perforation or abscess without bleeding: Secondary | ICD-10-CM

## 2015-06-22 DIAGNOSIS — D649 Anemia, unspecified: Secondary | ICD-10-CM | POA: Insufficient documentation

## 2015-06-22 DIAGNOSIS — R944 Abnormal results of kidney function studies: Secondary | ICD-10-CM | POA: Diagnosis not present

## 2015-06-22 DIAGNOSIS — M1990 Unspecified osteoarthritis, unspecified site: Secondary | ICD-10-CM | POA: Insufficient documentation

## 2015-06-22 DIAGNOSIS — R1031 Right lower quadrant pain: Secondary | ICD-10-CM

## 2015-06-22 DIAGNOSIS — R7989 Other specified abnormal findings of blood chemistry: Secondary | ICD-10-CM

## 2015-06-22 DIAGNOSIS — I129 Hypertensive chronic kidney disease with stage 1 through stage 4 chronic kidney disease, or unspecified chronic kidney disease: Secondary | ICD-10-CM | POA: Insufficient documentation

## 2015-06-22 DIAGNOSIS — Q612 Polycystic kidney, adult type: Secondary | ICD-10-CM

## 2015-06-22 DIAGNOSIS — E785 Hyperlipidemia, unspecified: Secondary | ICD-10-CM | POA: Diagnosis not present

## 2015-06-22 DIAGNOSIS — Q613 Polycystic kidney, unspecified: Secondary | ICD-10-CM | POA: Insufficient documentation

## 2015-06-22 DIAGNOSIS — N189 Chronic kidney disease, unspecified: Secondary | ICD-10-CM | POA: Diagnosis not present

## 2015-06-22 DIAGNOSIS — Z9889 Other specified postprocedural states: Secondary | ICD-10-CM | POA: Diagnosis not present

## 2015-06-22 LAB — CBC WITH DIFFERENTIAL/PLATELET
Basophils Absolute: 0 10*3/uL (ref 0.0–0.1)
Basophils Relative: 0 % (ref 0–1)
Eosinophils Absolute: 0.1 10*3/uL (ref 0.0–0.7)
Eosinophils Relative: 1 % (ref 0–5)
HEMATOCRIT: 28.5 % — AB (ref 36.0–46.0)
Hemoglobin: 9.5 g/dL — ABNORMAL LOW (ref 12.0–15.0)
Lymphocytes Relative: 8 % — ABNORMAL LOW (ref 12–46)
Lymphs Abs: 1 10*3/uL (ref 0.7–4.0)
MCH: 30.2 pg (ref 26.0–34.0)
MCHC: 33.3 g/dL (ref 30.0–36.0)
MCV: 90.5 fL (ref 78.0–100.0)
Monocytes Absolute: 1 10*3/uL (ref 0.1–1.0)
Monocytes Relative: 8 % (ref 3–12)
Neutro Abs: 10.5 10*3/uL — ABNORMAL HIGH (ref 1.7–7.7)
Neutrophils Relative %: 83 % — ABNORMAL HIGH (ref 43–77)
Platelets: 163 10*3/uL (ref 150–400)
RBC: 3.15 MIL/uL — ABNORMAL LOW (ref 3.87–5.11)
RDW: 12.6 % (ref 11.5–15.5)
WBC: 12.5 10*3/uL — AB (ref 4.0–10.5)

## 2015-06-22 LAB — URINALYSIS, ROUTINE W REFLEX MICROSCOPIC
Bilirubin Urine: NEGATIVE
GLUCOSE, UA: NEGATIVE mg/dL
Hgb urine dipstick: NEGATIVE
KETONES UR: NEGATIVE mg/dL
Nitrite: NEGATIVE
PH: 5.5 (ref 5.0–8.0)
Protein, ur: 100 mg/dL — AB
SPECIFIC GRAVITY, URINE: 1.012 (ref 1.005–1.030)
Urobilinogen, UA: 0.2 mg/dL (ref 0.0–1.0)

## 2015-06-22 LAB — COMPREHENSIVE METABOLIC PANEL
ALT: 12 U/L — AB (ref 14–54)
AST: 14 U/L — AB (ref 15–41)
Albumin: 3.3 g/dL — ABNORMAL LOW (ref 3.5–5.0)
Alkaline Phosphatase: 42 U/L (ref 38–126)
Anion gap: 10 (ref 5–15)
BILIRUBIN TOTAL: 0.6 mg/dL (ref 0.3–1.2)
BUN: 65 mg/dL — AB (ref 6–20)
CHLORIDE: 105 mmol/L (ref 101–111)
CO2: 20 mmol/L — ABNORMAL LOW (ref 22–32)
CREATININE: 4.44 mg/dL — AB (ref 0.44–1.00)
Calcium: 9.7 mg/dL (ref 8.9–10.3)
GFR calc Af Amer: 12 mL/min — ABNORMAL LOW (ref >60)
GFR, EST NON AFRICAN AMERICAN: 10 mL/min — AB (ref >60)
Glucose, Bld: 121 mg/dL — ABNORMAL HIGH (ref 65–99)
Potassium: 3.5 mmol/L (ref 3.5–5.1)
Sodium: 135 mmol/L (ref 135–145)
TOTAL PROTEIN: 6.9 g/dL (ref 6.5–8.1)

## 2015-06-22 LAB — LIPASE, BLOOD: Lipase: 23 U/L (ref 22–51)

## 2015-06-22 LAB — URINE MICROSCOPIC-ADD ON

## 2015-06-22 MED ORDER — ONDANSETRON 4 MG PO TBDP: ORAL_TABLET | ORAL | Status: DC

## 2015-06-22 MED ORDER — METRONIDAZOLE 500 MG PO TABS: 500.0000 mg | ORAL_TABLET | Freq: Three times a day (TID) | ORAL | Status: DC

## 2015-06-22 MED ORDER — HYDROCODONE-ACETAMINOPHEN 5-325 MG PO TABS: 1.0000 | ORAL_TABLET | Freq: Four times a day (QID) | ORAL | Status: DC | PRN

## 2015-06-22 MED ORDER — SODIUM CHLORIDE 0.9 % IV BOLUS (SEPSIS)
500.0000 mL | Freq: Once | INTRAVENOUS | Status: AC
  Administered 2015-06-22: 500 mL via INTRAVENOUS

## 2015-06-22 MED ORDER — CIPROFLOXACIN HCL 500 MG PO TABS: 500.0000 mg | ORAL_TABLET | Freq: Two times a day (BID) | ORAL | Status: DC

## 2015-06-22 MED ORDER — SODIUM CHLORIDE 0.9 % IV SOLN
Freq: Once | INTRAVENOUS | Status: AC
  Administered 2015-06-22: 50 mL/h via INTRAVENOUS

## 2015-06-22 MED ORDER — METRONIDAZOLE 500 MG PO TABS
500.0000 mg | ORAL_TABLET | Freq: Once | ORAL | Status: AC
  Administered 2015-06-22: 500 mg via ORAL
  Filled 2015-06-22: qty 1

## 2015-06-22 MED ORDER — ONDANSETRON HCL 4 MG/2ML IJ SOLN
4.0000 mg | Freq: Once | INTRAMUSCULAR | Status: AC
  Administered 2015-06-22: 4 mg via INTRAVENOUS
  Filled 2015-06-22: qty 2

## 2015-06-22 MED ORDER — IOHEXOL 300 MG/ML  SOLN
50.0000 mL | Freq: Once | INTRAMUSCULAR | Status: AC | PRN
Start: 2015-06-22 — End: 2015-06-22
  Administered 2015-06-22: 50 mL via ORAL

## 2015-06-22 MED ORDER — FENTANYL CITRATE (PF) 100 MCG/2ML IJ SOLN
50.0000 ug | Freq: Once | INTRAMUSCULAR | Status: AC
  Administered 2015-06-22: 50 ug via INTRAVENOUS
  Filled 2015-06-22: qty 2

## 2015-06-22 MED ORDER — CIPROFLOXACIN HCL 500 MG PO TABS
500.0000 mg | ORAL_TABLET | Freq: Once | ORAL | Status: AC
  Administered 2015-06-22: 500 mg via ORAL
  Filled 2015-06-22: qty 1

## 2015-06-22 NOTE — Discharge Instructions (Signed)
1. Medications: zofran, vicodin, Cipro, Flagyl, usual home medications 2. Treatment: rest, drink plenty of fluids, advance diet slowly 3. Follow Up: Please followup with your primary doctor in 24-48 hours for discussion of your diagnoses and further evaluation after today's visit; if you do not have a primary care doctor use the resource guide provided to find one; Please return to the ER for persistent vomiting, high fevers or worsening symptoms

## 2015-06-22 NOTE — ED Notes (Signed)
Pt c/o R side abd pain onset Friday, + nausea. Pt states pain became much worse around 0215. Pain worse with movement. Pt denies dysuria, denies v/d
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
## 2015-06-22 NOTE — ED Provider Notes (Signed)
Care assumed from Linda Creamer, NP  Linda Gilmore is a 59 y.o. female presents with generalized abd pain worse in the RLQ since 2am this morning.  Pt with Hx of polycystic kidney disease on the transplant list.  Pt reports baseline creatinine at 3.9-4.0.  Pt reports nausea without vomiting.    Pt. Followed by [facility].   Physical Exam  BP 124/63 mmHg  Pulse 88  Temp(Src) 98.3 F (36.8 C) (Oral)  Resp 19  Ht 5\' 7"  (1.702 m)  Wt 175 lb (79.379 kg)  BMI 27.40 kg/m2  SpO2 95%  LMP 06/01/2012  Physical Exam   Face to face Exam:   General: Awake  HEENT: Atraumatic  Resp: Normal effort  Cardiac: RRR Abd: Nondistended, soft without rebound, mild tenderness  Neuro:No focal weakness     ED Course  Procedures  1. Right lower quadrant abdominal pain   2. Diverticulitis of large intestine without perforation or abscess without bleeding   3. ADPKD (autosomal dominant polycystic kidney disease)   4. Elevated serum creatinine     MDM  Plan: CT scan pending.    7:35 AM Pt reports she is feeling better.  Her creatinine is 4.44 today. CT scan with diverticulitis.  Pt reports previous elective surgery for this in the past without recurrence.    Long discussion with patient about CT scan findings, concern for dehydration and increased creatinine.  I have recommended admission for IV antibiotics and rehydration; however, pt requests discharge home at this time.  She reports she will monitor her symptoms at home and will return if she develops vomiting, fever or worsening abd pain.  Risk and benefit of admission vs discharge home discussed including potential for worsening infection and kidney failure with potential for disability or death.  Pt is competent to make this decision and wishes for d/c home.  Will PO trial here with oral flagyl and cipro.     8:25 AM Pt reports she discussed the situation with her nephrologist Dr. Baird Cancer who has looked at her CT and labwork.  She  reports that he is comfortable with d/c home.  She has tolerated her medications here without new nausea or vomiting.  Will d/c to home.  Strict return precautions given.    BP 124/63 mmHg  Pulse 88  Temp(Src) 98.3 F (36.8 C) (Oral)  Resp 19  Ht 5\' 7"  (1.702 m)  Wt 175 lb (79.379 kg)  BMI 27.40 kg/m2  SpO2 95%  LMP 06/01/2012    Abigail Butts, PA-C 06/22/15 [id number]  Linton Flemings, MD 06/22/15 [id number]

## 2015-09-18 ENCOUNTER — Encounter (HOSPITAL_COMMUNITY): Payer: Commercial Managed Care - PPO

## 2015-09-19 ENCOUNTER — Ambulatory Visit: Payer: Commercial Managed Care - PPO | Admitting: Obstetrics & Gynecology

## 2015-09-19 ENCOUNTER — Other Ambulatory Visit (HOSPITAL_COMMUNITY): Payer: Self-pay | Admitting: *Deleted

## 2015-09-22 ENCOUNTER — Encounter (HOSPITAL_COMMUNITY)
Admission: RE | Admit: 2015-09-22 | Discharge: 2015-09-22 | Disposition: A | Discharge status: Home / Self Care | Payer: Commercial Managed Care - PPO | Source: Ambulatory Visit | Attending: Nephrology | Admitting: Nephrology

## 2015-09-22 DIAGNOSIS — D631 Anemia in chronic kidney disease: Secondary | ICD-10-CM | POA: Diagnosis not present

## 2015-09-22 DIAGNOSIS — N184 Chronic kidney disease, stage 4 (severe): Secondary | ICD-10-CM | POA: Diagnosis not present

## 2015-09-22 DIAGNOSIS — Z5181 Encounter for therapeutic drug level monitoring: Secondary | ICD-10-CM | POA: Insufficient documentation

## 2015-09-22 DIAGNOSIS — Z79899 Other long term (current) drug therapy: Secondary | ICD-10-CM | POA: Insufficient documentation

## 2015-09-22 HISTORY — PX: OTHER SURGICAL HISTORY: SHX169

## 2015-09-22 LAB — POCT HEMOGLOBIN-HEMACUE: Hemoglobin: 8.7 g/dL — ABNORMAL LOW (ref 12.0–15.0)

## 2015-09-22 MED ORDER — DARBEPOETIN ALFA 60 MCG/0.3ML IJ SOSY
PREFILLED_SYRINGE | INTRAMUSCULAR | Status: AC
  Administered 2015-09-22: 60 ug
  Filled 2015-09-22: qty 0.3

## 2015-09-22 MED ORDER — CLONIDINE HCL 0.1 MG PO TABS: 0.1000 mg | ORAL_TABLET | Freq: Once | ORAL | Status: DC | PRN

## 2015-09-23 ENCOUNTER — Encounter: Payer: Self-pay | Admitting: Obstetrics & Gynecology

## 2015-09-23 ENCOUNTER — Ambulatory Visit (INDEPENDENT_AMBULATORY_CARE_PROVIDER_SITE_OTHER): Payer: Commercial Managed Care - PPO | Admitting: Obstetrics & Gynecology

## 2015-09-23 VITALS — BP 118/76 | HR 68 | Resp 16 | Ht 66.75 in | Wt 180.0 lb

## 2015-09-23 DIAGNOSIS — Z124 Encounter for screening for malignant neoplasm of cervix: Secondary | ICD-10-CM

## 2015-09-23 DIAGNOSIS — Z01419 Encounter for gynecological examination (general) (routine) without abnormal findings: Secondary | ICD-10-CM | POA: Diagnosis not present

## 2015-09-23 DIAGNOSIS — Z0000 Encounter for general adult medical examination without abnormal findings: Secondary | ICD-10-CM

## 2015-09-23 LAB — POCT URINALYSIS DIPSTICK
BILIRUBIN UA: NEGATIVE
GLUCOSE UA: NEGATIVE
KETONES UA: NEGATIVE
Nitrite, UA: NEGATIVE
UROBILINOGEN UA: NEGATIVE
pH, UA: 5

## 2015-09-23 MED ORDER — ESTROGENS, CONJUGATED 0.625 MG/GM VA CREA: TOPICAL_CREAM | VAGINAL | Status: DC
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
## 2015-09-29 LAB — IPS PAP TEST WITH HPV

## 2015-10-20 ENCOUNTER — Ambulatory Visit (HOSPITAL_COMMUNITY)
Admission: RE | Admit: 2015-10-20 | Discharge: 2015-10-20 | Disposition: A | Discharge status: Home / Self Care | Payer: Commercial Managed Care - PPO | Source: Ambulatory Visit | Attending: Nephrology | Admitting: Nephrology

## 2015-10-20 DIAGNOSIS — Z5181 Encounter for therapeutic drug level monitoring: Secondary | ICD-10-CM | POA: Insufficient documentation

## 2015-10-20 DIAGNOSIS — D631 Anemia in chronic kidney disease: Secondary | ICD-10-CM | POA: Diagnosis not present

## 2015-10-20 DIAGNOSIS — Z79899 Other long term (current) drug therapy: Secondary | ICD-10-CM | POA: Diagnosis not present

## 2015-10-20 DIAGNOSIS — N184 Chronic kidney disease, stage 4 (severe): Secondary | ICD-10-CM | POA: Insufficient documentation

## 2015-10-20 LAB — RENAL FUNCTION PANEL
ALBUMIN: 3.3 g/dL — AB (ref 3.5–5.0)
ANION GAP: 10 (ref 5–15)
BUN: 82 mg/dL — ABNORMAL HIGH (ref 6–20)
CALCIUM: 9.7 mg/dL (ref 8.9–10.3)
CO2: 21 mmol/L — ABNORMAL LOW (ref 22–32)
Chloride: 108 mmol/L (ref 101–111)
Creatinine, Ser: 5.97 mg/dL — ABNORMAL HIGH (ref 0.44–1.00)
GFR calc Af Amer: 8 mL/min — ABNORMAL LOW (ref >60)
GFR, EST NON AFRICAN AMERICAN: 7 mL/min — AB (ref >60)
GLUCOSE: 97 mg/dL (ref 65–99)
PHOSPHORUS: 5.6 mg/dL — AB (ref 2.5–4.6)
POTASSIUM: 4.6 mmol/L (ref 3.5–5.1)
SODIUM: 139 mmol/L (ref 135–145)

## 2015-10-20 LAB — CBC
HCT: 28.1 % — ABNORMAL LOW (ref 36.0–46.0)
HEMOGLOBIN: 9.3 g/dL — AB (ref 12.0–15.0)
MCH: 30.8 pg (ref 26.0–34.0)
MCHC: 33.1 g/dL (ref 30.0–36.0)
MCV: 93 fL (ref 78.0–100.0)
PLATELETS: 183 10*3/uL (ref 150–400)
RBC: 3.02 MIL/uL — AB (ref 3.87–5.11)
RDW: 12.8 % (ref 11.5–15.5)
WBC: 5.4 10*3/uL (ref 4.0–10.5)

## 2015-10-20 LAB — IRON AND TIBC
IRON: 79 ug/dL (ref 28–170)
SATURATION RATIOS: 34 % — AB (ref 10.4–31.8)
TIBC: 234 ug/dL — ABNORMAL LOW (ref 250–450)
UIBC: 155 ug/dL

## 2015-10-20 LAB — FERRITIN: Ferritin: 198 ng/mL (ref 11–307)

## 2015-10-20 MED ORDER — DARBEPOETIN ALFA 60 MCG/0.3ML IJ SOSY
PREFILLED_SYRINGE | INTRAMUSCULAR | Status: AC
  Filled 2015-10-20: qty 0.3

## 2015-10-20 MED ORDER — DARBEPOETIN ALFA 60 MCG/0.3ML IJ SOSY
60.0000 ug | PREFILLED_SYRINGE | INTRAMUSCULAR | Status: DC
  Administered 2015-10-20: 60 ug via SUBCUTANEOUS

## 2015-10-20 NOTE — Discharge Instructions (Signed)
Darbepoetin Alfa injection What is this medicine? DARBEPOETIN ALFA (dar be POE e tin AL fa) helps your body make more red blood cells. It is used to treat anemia caused by chronic kidney failure and chemotherapy. This medicine may be used for other purposes; ask your health care provider or pharmacist if you have questions. What should I tell my health care provider before I take this medicine? They need to know if you have any of these conditions: -blood clotting disorders or history of blood clots -cancer patient not on chemotherapy -cystic fibrosis -heart disease, such as angina, heart failure, or a history of a heart attack -hemoglobin level of 12 g/dL or greater -high blood pressure -low levels of folate, iron, or vitamin B12 -seizures -an unusual or allergic reaction to darbepoetin, erythropoietin, albumin, hamster proteins, latex, other medicines, foods, dyes, or preservatives -pregnant or trying to get pregnant -breast-feeding How should I use this medicine? This medicine is for injection into a vein or under the skin. It is usually given by a health care professional in a hospital or clinic setting. If you get this medicine at home, you will be taught how to prepare and give this medicine. Do not shake the solution before you withdraw a dose. Use exactly as directed. Take your medicine at regular intervals. Do not take your medicine more often than directed. It is important that you put your used needles and syringes in a special sharps container. Do not put them in a trash can. If you do not have a sharps container, call your pharmacist or healthcare provider to get one. Talk to your pediatrician regarding the use of this medicine in children. While this medicine may be used in children as young as 1 year for selected conditions, precautions do apply. Overdosage: If you think you have taken too much of this medicine contact a poison control center or emergency room at once. NOTE:  This medicine is only for you. Do not share this medicine with others. What if I miss a dose? If you miss a dose, take it as soon as you can. If it is almost time for your next dose, take only that dose. Do not take double or extra doses. What may interact with this medicine? Do not take this medicine with any of the following medications: -epoetin alfa This list may not describe all possible interactions. Give your health care provider a list of all the medicines, herbs, non-prescription drugs, or dietary supplements you use. Also tell them if you smoke, drink alcohol, or use illegal drugs. Some items may interact with your medicine. What should I watch for while using this medicine? Visit your prescriber or health care professional for regular checks on your progress and for the needed blood tests and blood pressure measurements. It is especially important for the doctor to make sure your hemoglobin level is in the desired range, to limit the risk of potential side effects and to give you the best benefit. Keep all appointments for any recommended tests. Check your blood pressure as directed. Ask your doctor what your blood pressure should be and when you should contact him or her. As your body makes more red blood cells, you may need to take iron, folic acid, or vitamin B supplements. Ask your doctor or health care provider which products are right for you. If you have kidney disease continue dietary restrictions, even though this medication can make you feel better. Talk with your doctor or health care professional about the   foods you eat and the vitamins that you take. What side effects may I notice from receiving this medicine? Side effects that you should report to your doctor or health care professional as soon as possible: -allergic reactions like skin rash, itching or hives, swelling of the face, lips, or tongue -breathing problems -changes in vision -chest pain -confusion, trouble speaking  or understanding -feeling faint or lightheaded, falls -high blood pressure -muscle aches or pains -pain, swelling, warmth in the leg -rapid weight gain -severe headaches -sudden numbness or weakness of the face, arm or leg -trouble walking, dizziness, loss of balance or coordination -seizures (convulsions) -swelling of the ankles, feet, hands -unusually weak or tired Side effects that usually do not require medical attention (report to your doctor or health care professional if they continue or are bothersome): -diarrhea -fever, chills (flu-like symptoms) -headaches -nausea, vomiting -redness, stinging, or swelling at site where injected This list may not describe all possible side effects. Call your doctor for medical advice about side effects. You may report side effects to FDA at 1-800-FDA-1088. Where should I keep my medicine? Keep out of the reach of children. Store in a refrigerator between 2 and 8 degrees C (36 and 46 degrees F). Do not freeze. Do not shake. Throw away any unused portion if using a single-dose vial. Throw away any unused medicine after the expiration date. NOTE: This sheet is a summary. It may not cover all possible information. If you have questions about this medicine, talk to your doctor, pharmacist, or health care provider.    2016, Elsevier/Gold Standard. (2008-10-01 10:23:57)  

## 2015-10-21 LAB — PTH, INTACT AND CALCIUM
CALCIUM TOTAL (PTH): 9.6 mg/dL (ref 8.7–10.2)
PTH: 118 pg/mL — AB (ref 15–65)

## 2015-11-02 HISTORY — PX: KIDNEY TRANSPLANT: SHX239

## 2015-11-12 DIAGNOSIS — Z940 Kidney transplant status: Secondary | ICD-10-CM | POA: Insufficient documentation

## 2015-11-24 ENCOUNTER — Telehealth: Payer: Self-pay | Admitting: Obstetrics & Gynecology

## 2015-11-24 NOTE — Telephone Encounter (Signed)
Spoke with patient. Patient states that she had a kidney transplant on 11/11/2015. Patient states that when she gets up to ambulate she has started to feel increased pelvic pressure. "It feels like my bladder is pressing on my surgical area and causing me pain in my side as well." Patient was last seen in the office on 09/23/2015 with Dr.Miller and had spotting with her pessary. She was advised to keep her pessary out for a "while." Patient spoke with her surgeon who recommended that she speak with Dr.Miller to see if putting her pessary in at this time was okay to see if this helps. Denies any current bleeding or difficulty emptying her bladder. The patient has stopped her Premarin cream as she was instructed to do so prior to surgery. States she will check on when her surgeon recommends she restart her Premarin.

## 2015-11-24 NOTE — Telephone Encounter (Signed)
I think it's ok to use her pessary.  Let her know I've been praying for her and for Iona Beard (her donor).

## 2015-11-24 NOTE — Telephone Encounter (Signed)
Patient needs to speak with the nurse today she just recently has a kidney transplant and is experiencing some issue that may be GYN related.

## 2015-11-24 NOTE — Telephone Encounter (Signed)
Patient has some questions for Dr.Miller's nurse regarding her options after removing her pessary.

## 2015-11-24 NOTE — Telephone Encounter (Signed)
Spoke with patient. Advised of message as seen below from [place]. Patient is agreeable. Patient is grateful for Dr.Miller's thoughts and prayers. Will return call if she needs anything.  Routing to provider for final review. Patient agreeable to disposition. Will close encounter.

## 2016-03-15 ENCOUNTER — Other Ambulatory Visit: Payer: Self-pay | Admitting: Nephrology

## 2016-03-15 DIAGNOSIS — R1030 Lower abdominal pain, unspecified: Secondary | ICD-10-CM

## 2016-03-15 DIAGNOSIS — Z940 Kidney transplant status: Secondary | ICD-10-CM

## 2016-03-22 ENCOUNTER — Ambulatory Visit
Admission: RE | Admit: 2016-03-22 | Discharge: 2016-03-22 | Disposition: A | Discharge status: Home / Self Care | Payer: Commercial Managed Care - PPO | Source: Ambulatory Visit | Attending: Nephrology | Admitting: Nephrology

## 2016-03-22 ENCOUNTER — Other Ambulatory Visit: Payer: Self-pay | Admitting: Nephrology

## 2016-03-22 DIAGNOSIS — Z940 Kidney transplant status: Secondary | ICD-10-CM

## 2016-03-22 DIAGNOSIS — R1030 Lower abdominal pain, unspecified: Secondary | ICD-10-CM

## 2016-04-27 ENCOUNTER — Ambulatory Visit
Admission: RE | Admit: 2016-04-27 | Discharge: 2016-04-27 | Disposition: A | Discharge status: Home / Self Care | Payer: Commercial Managed Care - PPO | Source: Ambulatory Visit | Attending: Family Medicine | Admitting: Family Medicine

## 2016-04-27 ENCOUNTER — Other Ambulatory Visit: Payer: Self-pay | Admitting: Family Medicine

## 2016-04-27 DIAGNOSIS — R053 Chronic cough: Secondary | ICD-10-CM

## 2016-04-27 DIAGNOSIS — R05 Cough: Secondary | ICD-10-CM

## 2016-06-17 ENCOUNTER — Telehealth: Payer: Self-pay | Admitting: *Deleted

## 2016-06-17 NOTE — Telephone Encounter (Signed)
Called patient to review BMD results. Patient verbalized understanding. Patient states that her nephrologist had taken her off her calcium supplements because her calcium levels were too high. Patient states she has also been avoiding foods high in calcium. Patient concerned about the osteopenia, and wanted to know if Dr. Sabra Heck recommended any other supplements, or if she should defer to her nephrologist. RN advised patient she would send this message to Dr. Sabra Heck and return call to patient of Dr. Ammie Ferrier response. Patient agreeable.   Routing to provider for review.

## 2016-06-18 NOTE — Telephone Encounter (Signed)
Do you still have copy of BMD.  I need to be able to see it to give additional response to her questions.  Thanks.

## 2016-06-21 MED ORDER — HYDROCORTISONE 2.5 % RE CREA: 1.0000 "application " | TOPICAL_CREAM | Freq: Two times a day (BID) | RECTAL | 1 refills | Status: DC

## 2016-06-21 NOTE — Telephone Encounter (Signed)
Please let her know this is very, very mild osteopenia and she does not need to do anything else at this time.  If she wanted to be very conservative about this, then she could repeat the BMD in two years, not 3-5 as I suggested.  She does not need to be on any treatment for this right now.  That is not indicated.

## 2016-06-21 NOTE — Telephone Encounter (Signed)
Rx for Anusol HC 2.5% to be used BID for up to 7 days for hemorrhoids.  Rx with #1 RF to pharmacy on file.

## 2016-06-21 NOTE — Telephone Encounter (Signed)
Yes. Placed copy of BMD on your desk.

## 2016-06-21 NOTE — Telephone Encounter (Signed)
Called patient to give message as seen below by Dr. Sabra Heck. Patient verbalized understanding and appreciative of call.   Patient states she was also going to call our office this afternoon because she has hemorrhoids and states Dr. Sabra Heck usually prescribes her something topical that works better than preparation h. Patient requesting prescription be sent to [facility] on [address]. RN stated she would send this message to Dr. Sabra Heck so she could advise about prescription.   Routing to provider for review.

## 2016-06-22 NOTE — Telephone Encounter (Signed)
Noted message from Dr. Miller. Encounter closed.   

## 2016-06-24 ENCOUNTER — Encounter: Payer: Self-pay | Admitting: Obstetrics & Gynecology

## 2016-12-07 ENCOUNTER — Ambulatory Visit (INDEPENDENT_AMBULATORY_CARE_PROVIDER_SITE_OTHER): Payer: Commercial Managed Care - PPO

## 2016-12-07 ENCOUNTER — Ambulatory Visit (INDEPENDENT_AMBULATORY_CARE_PROVIDER_SITE_OTHER): Payer: Commercial Managed Care - PPO | Admitting: Orthopaedic Surgery

## 2016-12-07 ENCOUNTER — Encounter (INDEPENDENT_AMBULATORY_CARE_PROVIDER_SITE_OTHER): Payer: Self-pay | Admitting: Orthopaedic Surgery

## 2016-12-07 VITALS — BP 131/72 | HR 77 | Resp 13 | Ht 67.0 in | Wt 165.0 lb

## 2016-12-07 DIAGNOSIS — M79672 Pain in left foot: Secondary | ICD-10-CM | POA: Diagnosis not present

## 2016-12-07 DIAGNOSIS — S90122A Contusion of left lesser toe(s) without damage to nail, initial encounter: Secondary | ICD-10-CM | POA: Diagnosis not present

## 2016-12-07 DIAGNOSIS — R2242 Localized swelling, mass and lump, left lower limb: Secondary | ICD-10-CM
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
## 2016-12-29 ENCOUNTER — Encounter (INDEPENDENT_AMBULATORY_CARE_PROVIDER_SITE_OTHER): Payer: Self-pay | Admitting: Orthopedic Surgery

## 2016-12-29 ENCOUNTER — Ambulatory Visit (INDEPENDENT_AMBULATORY_CARE_PROVIDER_SITE_OTHER): Payer: Commercial Managed Care - PPO | Admitting: Orthopedic Surgery

## 2016-12-29 VITALS — BP 120/70 | HR 90 | Resp 13 | Ht 67.0 in | Wt 165.0 lb

## 2016-12-29 DIAGNOSIS — M79672 Pain in left foot: Secondary | ICD-10-CM

## 2016-12-29 DIAGNOSIS — R2242 Localized swelling, mass and lump, left lower limb: Secondary | ICD-10-CM
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
## 2017-01-03 ENCOUNTER — Ambulatory Visit (INDEPENDENT_AMBULATORY_CARE_PROVIDER_SITE_OTHER): Payer: Commercial Managed Care - PPO | Admitting: Obstetrics & Gynecology

## 2017-01-03 ENCOUNTER — Encounter: Payer: Self-pay | Admitting: Obstetrics & Gynecology

## 2017-01-03 ENCOUNTER — Other Ambulatory Visit (HOSPITAL_COMMUNITY)
Admission: RE | Admit: 2017-01-03 | Discharge: 2017-01-03 | Disposition: A | Discharge status: Home / Self Care | Payer: Commercial Managed Care - PPO | Source: Ambulatory Visit | Attending: Obstetrics & Gynecology | Admitting: Obstetrics & Gynecology

## 2017-01-03 VITALS — BP 116/72 | HR 80 | Resp 16 | Ht 67.0 in | Wt 175.0 lb

## 2017-01-03 DIAGNOSIS — D899 Disorder involving the immune mechanism, unspecified: Secondary | ICD-10-CM | POA: Diagnosis not present

## 2017-01-03 DIAGNOSIS — D849 Immunodeficiency, unspecified: Secondary | ICD-10-CM

## 2017-01-03 DIAGNOSIS — Z1151 Encounter for screening for human papillomavirus (HPV): Secondary | ICD-10-CM | POA: Insufficient documentation

## 2017-01-03 DIAGNOSIS — N939 Abnormal uterine and vaginal bleeding, unspecified: Secondary | ICD-10-CM

## 2017-01-03 DIAGNOSIS — Z01411 Encounter for gynecological examination (general) (routine) with abnormal findings: Secondary | ICD-10-CM | POA: Diagnosis not present

## 2017-01-03 DIAGNOSIS — Z01419 Encounter for gynecological examination (general) (routine) without abnormal findings: Secondary | ICD-10-CM | POA: Insufficient documentation

## 2017-01-03 DIAGNOSIS — Z940 Kidney transplant status: Secondary | ICD-10-CM | POA: Diagnosis not present

## 2017-01-03 DIAGNOSIS — N814 Uterovaginal prolapse, unspecified: Secondary | ICD-10-CM | POA: Diagnosis not present

## 2017-01-03 DIAGNOSIS — N812 Incomplete uterovaginal prolapse: Secondary | ICD-10-CM | POA: Diagnosis not present

## 2017-01-03 DIAGNOSIS — Z124 Encounter for screening for malignant neoplasm of cervix: Secondary | ICD-10-CM

## 2017-01-03 MED ORDER — ESTROGENS, CONJUGATED 0.625 MG/GM VA CREA: TOPICAL_CREAM | VAGINAL | 1 refills | Status: DC

## 2017-01-05 LAB — CYTOLOGY - PAP
DIAGNOSIS: NEGATIVE
HPV (WINDOPATH): NOT DETECTED

## 2017-01-14 ENCOUNTER — Ambulatory Visit: Payer: Commercial Managed Care - PPO | Admitting: Obstetrics & Gynecology

## 2017-03-10 ENCOUNTER — Encounter (INDEPENDENT_AMBULATORY_CARE_PROVIDER_SITE_OTHER): Payer: Self-pay | Admitting: Orthopaedic Surgery

## 2017-03-10 ENCOUNTER — Ambulatory Visit (INDEPENDENT_AMBULATORY_CARE_PROVIDER_SITE_OTHER): Payer: Commercial Managed Care - PPO

## 2017-03-10 ENCOUNTER — Ambulatory Visit (INDEPENDENT_AMBULATORY_CARE_PROVIDER_SITE_OTHER): Payer: Commercial Managed Care - PPO | Admitting: Orthopaedic Surgery

## 2017-03-10 VITALS — Ht 67.0 in | Wt 175.0 lb

## 2017-03-10 DIAGNOSIS — M545 Low back pain, unspecified: Secondary | ICD-10-CM

## 2017-03-10 MED ORDER — TRAMADOL HCL 50 MG PO TABS: 50.0000 mg | ORAL_TABLET | Freq: Two times a day (BID) | ORAL | 0 refills | Status: DC | PRN

## 2017-03-10 MED ORDER — METHOCARBAMOL 500 MG PO TABS: 500.0000 mg | ORAL_TABLET | Freq: Two times a day (BID) | ORAL | 0 refills | Status: DC | PRN
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
## 2017-03-11 ENCOUNTER — Other Ambulatory Visit (INDEPENDENT_AMBULATORY_CARE_PROVIDER_SITE_OTHER): Payer: Self-pay

## 2017-03-11 DIAGNOSIS — M5440 Lumbago with sciatica, unspecified side: Secondary | ICD-10-CM

## 2017-04-27 ENCOUNTER — Other Ambulatory Visit (INDEPENDENT_AMBULATORY_CARE_PROVIDER_SITE_OTHER): Payer: Self-pay | Admitting: Orthopaedic Surgery

## 2017-04-29 ENCOUNTER — Telehealth (INDEPENDENT_AMBULATORY_CARE_PROVIDER_SITE_OTHER): Payer: Self-pay | Admitting: Orthopaedic Surgery

## 2017-04-29 NOTE — Telephone Encounter (Signed)
Patient called inquiring if we have received a refill request for her Robaxin from the pharmacy.  She will run out of the medication over the weekend.  CB#[phone].  Thank you.

## 2017-04-30 ENCOUNTER — Other Ambulatory Visit (INDEPENDENT_AMBULATORY_CARE_PROVIDER_SITE_OTHER): Payer: Self-pay | Admitting: Orthopedic Surgery

## 2017-04-30 MED ORDER — METHOCARBAMOL 500 MG PO TABS: 500.0000 mg | ORAL_TABLET | Freq: Two times a day (BID) | ORAL | 0 refills | Status: DC | PRN

## 2017-04-30 NOTE — Telephone Encounter (Signed)
escribed robaxin in

## 2017-05-19 NOTE — Unmapped (Signed)
Specialty Pharmacy Refill Coordination Note     Nailea Whitehorn is a 61 y.o. female contacted today regarding refills of her specialty medication(s).    Reviewed and verified with patient:     [address]  [place] [address]  Specialty medication(s) and dose(s) confirmed: yes  Changes to medications: no  Changes to insurance: no    Medication Adherence    Patient reported X missed doses in the last month:  0  Specialty Medication:  ENVARUS 4 MG  Patient is on additional specialty medications:  Yes  Additional Specialty Medications:  MYFORTIC 180 MG  Patient Reported Additional Medication X Missed Doses in the Last Month:  0  Medication Assistance Program  Refill Coordination  Has the Patient's Contact Information Changed:  No  Is the Shipping Address Different:  No  Shipping Information  Delivery Scheduled:  Yes  Delivery Date:  05/25/17  Medications to be Shipped:  ENVARSUS 4 MG  MYFORTIC 180 MG          ENVARSUS 4 MG= LAST MONTH = 60  REMAIN = 20    Myfortic 180 mg   Quantity filled last month: 180   # of tablets left on hand: 60     Lavonda Jumbo

## 2017-05-24 MED ORDER — ENVARSUS XR 4 MG TABLET,EXTENDED RELEASE
ORAL_TABLET | Freq: Every day | ORAL | 11 refills | 0 days | Status: CP
Start: 2017-05-24 — End: 2017-08-18

## 2017-05-24 MED FILL — ENVARSUS XR/4MG/TAB: ENVARSUS XR/4MG/TAB | 30 days supply | Qty: 60 | Fill #0

## 2017-05-24 MED FILL — MYFORTIC/180MG/TAB: MYFORTIC/180MG/TAB | 30 days supply | Qty: 180 | Fill #2

## 2017-05-25 LAB — CBC W/ DIFFERENTIAL
BASOPHILS ABSOLUTE COUNT: 0 10*3/uL (ref 0.0–0.2)
BASOPHILS RELATIVE PERCENT: 0 %
EOSINOPHILS RELATIVE PERCENT: 3 %
HEMATOCRIT: 35.3 % (ref 34.0–46.6)
HEMOGLOBIN: 11.8 g/dL (ref 11.1–15.9)
LYMPHOCYTES ABSOLUTE COUNT: 0.7 10*3/uL (ref 0.7–3.1)
LYMPHOCYTES RELATIVE PERCENT: 17 %
MEAN CORPUSCULAR HEMOGLOBIN CONC: 33.4 g/dL (ref 31.5–35.7)
MEAN CORPUSCULAR HEMOGLOBIN: 27.8 pg (ref 26.6–33.0)
MEAN CORPUSCULAR VOLUME: 83 fL (ref 79–97)
MONOCYTES ABSOLUTE COUNT: 0.3 10*3/uL (ref 0.1–0.9)
MONOCYTES RELATIVE PERCENT: 8 %
NEUTROPHILS ABSOLUTE COUNT: 3.1 10*3/uL (ref 1.4–7.0)
NEUTROPHILS RELATIVE PERCENT: 72 %
PLATELET COUNT: 185 10*3/uL (ref 150–379)
RED BLOOD CELL COUNT: 4.25 x10E6/uL (ref 3.77–5.28)
RED CELL DISTRIBUTION WIDTH: 13.1 % (ref 12.3–15.4)
WHITE BLOOD CELL COUNT: 4.3 10*3/uL (ref 3.4–10.8)

## 2017-05-25 LAB — BASIC METABOLIC PANEL
BLOOD UREA NITROGEN: 17 mg/dL (ref 8–27)
BUN / CREAT RATIO: 14 (ref 12–28)
CALCIUM: 10.3 mg/dL (ref 8.7–10.3)
CHLORIDE: 100 mmol/L (ref 96–106)
CO2: 22 mmol/L (ref 20–29)

## 2017-05-25 LAB — MAGNESIUM: Lab: 1.5 — ABNORMAL LOW

## 2017-05-25 LAB — TACROLIMUS BLOOD: Lab: 5.9

## 2017-05-25 LAB — PHOSPHORUS, SERUM: Lab: 3.1

## 2017-05-25 LAB — NEUTROPHILS RELATIVE PERCENT: Lab: 72

## 2017-05-25 LAB — BUN / CREAT RATIO: Lab: 14

## 2017-05-27 ENCOUNTER — Telehealth (INDEPENDENT_AMBULATORY_CARE_PROVIDER_SITE_OTHER): Payer: Self-pay | Admitting: Orthopaedic Surgery

## 2017-05-27 NOTE — Telephone Encounter (Signed)
PT Recertification Note faxed 05/27/17

## 2017-06-10 NOTE — Unmapped (Signed)
[facility] orders updated w/ quarterly labs

## 2017-06-16 ENCOUNTER — Encounter (INDEPENDENT_AMBULATORY_CARE_PROVIDER_SITE_OTHER): Payer: Self-pay | Admitting: Orthopaedic Surgery

## 2017-06-16 ENCOUNTER — Ambulatory Visit (INDEPENDENT_AMBULATORY_CARE_PROVIDER_SITE_OTHER): Payer: Commercial Managed Care - PPO | Admitting: Orthopaedic Surgery

## 2017-06-16 VITALS — BP 125/72 | HR 92 | Resp 14 | Ht 66.0 in | Wt 165.0 lb

## 2017-06-16 DIAGNOSIS — M5440 Lumbago with sciatica, unspecified side: Secondary | ICD-10-CM
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
## 2017-06-21 ENCOUNTER — Telehealth (INDEPENDENT_AMBULATORY_CARE_PROVIDER_SITE_OTHER): Payer: Self-pay | Admitting: Orthopaedic Surgery

## 2017-06-21 ENCOUNTER — Other Ambulatory Visit (INDEPENDENT_AMBULATORY_CARE_PROVIDER_SITE_OTHER): Payer: Self-pay | Admitting: Orthopedic Surgery

## 2017-06-21 MED ORDER — METHOCARBAMOL 500 MG PO TABS: 500.0000 mg | ORAL_TABLET | Freq: Two times a day (BID) | ORAL | 0 refills | Status: DC | PRN

## 2017-06-21 NOTE — Telephone Encounter (Signed)
Patient requesting a refill on Methocarbomol. Patient uses [facility] on [address].

## 2017-06-21 NOTE — Telephone Encounter (Signed)
Please advise 

## 2017-06-21 NOTE — Unmapped (Signed)
[facility] Specialty Pharmacy Refill Coordination Note  Specialty Medication(s): Envarsus 4 mg (Myfortic not needed at this time)  Additional Medications shipped: Cindy Riley, DOB: June 15, 1956  Phone: [phone] (home) [phone] (work), Alternate phone contact: N/A  Phone or address changes today?: No  All above HIPAA information was verified with patient.  Shipping Address: [address]  [place] [address]   Insurance changes? No    Completed refill call assessment today to schedule patient's medication shipment from the [facility] [phone]).      Confirmed the medication and dosage are correct and have not changed: Yes, regimen is correct and unchanged.    Confirmed patient started or stopped the following medications in the past month:  No, there are no changes reported at this time.    Are you tolerating your medication?:  Cindy Riley reports tolerating the medication.    ADHERENCE    (Below is required for Medicare Part B or Transplant patients only - per drug):   How many tablets were dispensed last month: Envarsus xr 4 mg - 60  Patient currently has 12 remaining.    Did you miss any doses in the past 4 weeks? Yes.  Cindy Riley reports missing 1 days of medication therapy in the last 4 weeks.  Cindy Riley reports travel as the cause of their non-adherance.    FINANCIAL/SHIPPING    Delivery Scheduled: Yes, Expected medication delivery date: Friday, Aug 24     Cindy Riley did not have any additional questions at this time.    Delivery address validated in [facility] scheduling system: Yes, address listed in [facility] is correct.    We will follow up with patient monthly for standard refill processing and delivery.      Thank you,  Tawanna Solo [facility] Specialty Pharmacist

## 2017-06-22 MED FILL — ENVARSUS XR/4MG/TAB: ENVARSUS XR/4MG/TAB | 30 days supply | Qty: 60 | Fill #1

## 2017-06-26 ENCOUNTER — Ambulatory Visit
Admission: RE | Admit: 2017-06-26 | Discharge: 2017-06-26 | Disposition: A | Discharge status: Home / Self Care | Payer: Commercial Managed Care - PPO | Source: Ambulatory Visit | Attending: Orthopaedic Surgery | Admitting: Orthopaedic Surgery

## 2017-06-26 DIAGNOSIS — M5440 Lumbago with sciatica, unspecified side: Secondary | ICD-10-CM

## 2017-06-27 ENCOUNTER — Other Ambulatory Visit (INDEPENDENT_AMBULATORY_CARE_PROVIDER_SITE_OTHER): Payer: Self-pay

## 2017-06-27 DIAGNOSIS — M5440 Lumbago with sciatica, unspecified side: Secondary | ICD-10-CM

## 2017-06-27 NOTE — Progress Notes (Signed)
Refer to Dr Ernestina Patches for lumbar injection

## 2017-06-27 NOTE — Progress Notes (Signed)
Sent referral 

## 2017-07-06 LAB — CBC W/ DIFFERENTIAL
BANDED NEUTROPHILS ABSOLUTE COUNT: 0.1 10*3/uL (ref 0.0–0.1)
BASOPHILS ABSOLUTE COUNT: 0 10*3/uL (ref 0.0–0.2)
BASOPHILS RELATIVE PERCENT: 0 %
EOSINOPHILS ABSOLUTE COUNT: 0.1 10*3/uL (ref 0.0–0.4)
EOSINOPHILS RELATIVE PERCENT: 2 %
HEMOGLOBIN: 11.7 g/dL (ref 11.1–15.9)
IMMATURE GRANULOCYTES: 2 %
LYMPHOCYTES ABSOLUTE COUNT: 1.1 10*3/uL (ref 0.7–3.1)
LYMPHOCYTES RELATIVE PERCENT: 29 %
MEAN CORPUSCULAR HEMOGLOBIN CONC: 32.1 g/dL (ref 31.5–35.7)
MEAN CORPUSCULAR HEMOGLOBIN: 27.3 pg (ref 26.6–33.0)
MEAN CORPUSCULAR VOLUME: 85 fL (ref 79–97)
MONOCYTES ABSOLUTE COUNT: 0.4 10*3/uL (ref 0.1–0.9)
MONOCYTES RELATIVE PERCENT: 11 %
NEUTROPHILS ABSOLUTE COUNT: 2.2 10*3/uL (ref 1.4–7.0)
PLATELET COUNT: 189 10*3/uL (ref 150–379)
RED BLOOD CELL COUNT: 4.28 x10E6/uL (ref 3.77–5.28)
RED CELL DISTRIBUTION WIDTH: 14.1 % (ref 12.3–15.4)
WHITE BLOOD CELL COUNT: 3.8 10*3/uL (ref 3.4–10.8)

## 2017-07-06 LAB — BASIC METABOLIC PANEL
BLOOD UREA NITROGEN: 19 mg/dL (ref 8–27)
BUN / CREAT RATIO: 15 (ref 12–28)
CALCIUM: 9.8 mg/dL (ref 8.7–10.3)
CHLORIDE: 96 mmol/L (ref 96–106)
CREATININE: 1.27 mg/dL — ABNORMAL HIGH (ref 0.57–1.00)
GLUCOSE: 103 mg/dL — ABNORMAL HIGH (ref 65–99)

## 2017-07-06 LAB — LIPID PANEL
HDL CHOLESTEROL: 42 mg/dL
LDL CHOLESTEROL CALCULATED: 73 mg/dL (ref 0–99)
TRIGLYCERIDES: 161 mg/dL — ABNORMAL HIGH (ref 0–149)

## 2017-07-06 LAB — MAGNESIUM: Lab: 1.6

## 2017-07-06 LAB — RED CELL DISTRIBUTION WIDTH: Lab: 14.1

## 2017-07-06 LAB — CHLORIDE: Lab: 96

## 2017-07-06 LAB — PHOSPHORUS, SERUM: Lab: 3.1

## 2017-07-06 LAB — HDL CHOLESTEROL: Lab: 42

## 2017-07-07 LAB — TACROLIMUS BLOOD: Lab: 8.1

## 2017-07-07 NOTE — Unmapped (Signed)
[facility] Specialty Pharmacy Refill and Clinical Coordination Note  Medication(s): ENVARSUS 4 AND MYFORTIC 180    Rada C Blough, DOB: [birth year]  Phone: [phone] (home) [phone] (work), Alternate phone contact: N/A  Shipping address: [address]  [place] [address]  Phone or address changes today?: No  All above HIPAA information verified.  Insurance changes? No    Completed refill and clinical call assessment today to schedule patient's medication shipment from the [facility] [phone]).      MEDICATION RECONCILIATION    Confirmed the medication and dosage are correct and have not changed: Yes, regimen is correct and unchanged.    Were there any changes to your medication(s) in the past month:  No, there are no changes reported at this time.    ADHERENCE    Is this medicine transplant or covered by Medicare Part B? No.      Did you miss any doses in the past 4 weeks? No missed doses reported.  Adherence counseling provided? Not needed     SIDE EFFECT MANAGEMENT    Are you tolerating your medication?:  Cyrstal reports tolerating the medication.  Side effect management discussed: None      Therapy is appropriate and should be continued.    Evidence of clinical benefit: See Epic note from 03/15/17      FINANCIAL/SHIPPING    Delivery Scheduled: Yes, Expected medication delivery date: 07/19/17   Additional medications refilled: No additional medications/refills needed at this time.    Adie did not have any additional questions at this time.    Delivery address validated in [facility] scheduling system: Yes, address listed above is correct.      We will follow up with patient monthly for standard refill processing and delivery.      Thank you,  Mickle Mallory   [facility] Specialty Pharmacist

## 2017-07-18 ENCOUNTER — Ambulatory Visit (INDEPENDENT_AMBULATORY_CARE_PROVIDER_SITE_OTHER): Payer: Self-pay

## 2017-07-18 ENCOUNTER — Encounter (INDEPENDENT_AMBULATORY_CARE_PROVIDER_SITE_OTHER): Payer: Self-pay | Admitting: Physical Medicine and Rehabilitation

## 2017-07-18 ENCOUNTER — Ambulatory Visit (INDEPENDENT_AMBULATORY_CARE_PROVIDER_SITE_OTHER): Payer: Commercial Managed Care - PPO | Admitting: Physical Medicine and Rehabilitation

## 2017-07-18 VITALS — BP 125/71 | HR 87

## 2017-07-18 DIAGNOSIS — M5136 Other intervertebral disc degeneration, lumbar region: Secondary | ICD-10-CM | POA: Diagnosis not present

## 2017-07-18 DIAGNOSIS — M545 Low back pain, unspecified: Secondary | ICD-10-CM

## 2017-07-18 DIAGNOSIS — G8929 Other chronic pain: Secondary | ICD-10-CM

## 2017-07-18 DIAGNOSIS — M5116 Intervertebral disc disorders with radiculopathy, lumbar region: Secondary | ICD-10-CM | POA: Diagnosis not present

## 2017-07-18 MED ORDER — BETAMETHASONE SOD PHOS & ACET 6 (3-3) MG/ML IJ SUSP
12.0000 mg | Freq: Once | INTRAMUSCULAR | Status: AC
  Administered 2017-07-18: 12 mg

## 2017-07-18 MED ORDER — LIDOCAINE HCL (PF) 1 % IJ SOLN
2.0000 mL | Freq: Once | INTRAMUSCULAR | Status: AC
  Administered 2017-07-18: 2 mL

## 2017-07-18 MED FILL — MYFORTIC/180MG/TAB: MYFORTIC/180MG/TAB | 30 days supply | Qty: 180 | Fill #3

## 2017-07-18 MED FILL — ENVARSUS XR/4MG/TAB: ENVARSUS XR/4MG/TAB | 30 days supply | Qty: 60 | Fill #2

## 2017-07-18 NOTE — Progress Notes (Deleted)
Lower back pain. Mainly on left side. Constant dull ache or tightness. Pain typically worse with washing dishes an after sitting long periods. Denies having any pain down legs.

## 2017-07-18 NOTE — Patient Instructions (Signed)

## 2017-07-22 ENCOUNTER — Encounter (INDEPENDENT_AMBULATORY_CARE_PROVIDER_SITE_OTHER): Payer: Self-pay | Admitting: Physical Medicine and Rehabilitation
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
## 2017-07-22 NOTE — Procedures (Signed)
Lumbar Epidural Steroid Injection - Interlaminar Approach with Fluoroscopic Guidance  Patient: Linda Gilmore      Date of Birth: Jul 16, 1956 MRN: [id number] PCP: Darcus Austin, MD      Visit Date: 07/18/2017   Universal Protocol:     Consent Given By: the patient  Position: PRONE  Additional Comments: Vital signs were monitored before and after the procedure. Patient was prepped and draped in the usual sterile fashion. The correct patient, procedure, and site was verified.   Injection Procedure Details:  Procedure Site One Meds Administered:  Meds ordered this encounter  Medications  . lidocaine (PF) (XYLOCAINE) 1 % injection 2 mL  . betamethasone acetate-betamethasone sodium phosphate (CELESTONE) injection 12 mg     Laterality: Left  Location/Site:  L4-L5  Needle size: 20 G  Needle type: Tuohy  Needle Placement: Paramedian epidural  Findings:  -Contrast Used: 0.5 mL iohexol 180 mg iodine/mL   -Comments: Excellent flow of contrast into the epidural space.  Procedure Details: Using a paramedian approach from the side mentioned above, the region overlying the inferior lamina was localized under fluoroscopic visualization and the soft tissues overlying this structure were infiltrated with 4 ml. of 1% Lidocaine without Epinephrine. The Tuohy needle was inserted into the epidural space using a paramedian approach.   The epidural space was localized using loss of resistance along with lateral and bi-planar fluoroscopic views.  After negative aspirate for air, blood, and CSF, a 2 ml. volume of Isovue-250 was injected into the epidural space and the flow of contrast was observed. Radiographs were obtained for documentation purposes.    The injectate was administered into the level noted above.   Additional Comments:  The patient tolerated the procedure well Dressing: Band-Aid    Post-procedure details: Patient was observed during the procedure. Post-procedure  instructions were reviewed.  Patient left the clinic in stable condition.

## 2017-07-26 ENCOUNTER — Encounter: Payer: Self-pay | Admitting: Obstetrics & Gynecology

## 2017-08-08 NOTE — Unmapped (Signed)
[name] Refill Coordination Note  Specialty Medication(s): Myfortic, Envarsus  Additional Medications shipped: Cindy Riley, DOB: 05-02-1956  Phone: [phone] (home) [phone] (work), Alternate phone contact: N/A  Phone or address changes today?: No  All above HIPAA information was verified with patient.  Shipping Address: [address]  [place] [address]   Insurance changes? No    Completed refill call assessment today to schedule patient's medication shipment from the [facility] [phone]).      Confirmed the medication and dosage are correct and have not changed: Yes, regimen is correct and unchanged.    Confirmed patient started or stopped the following medications in the past month:  No, there are no changes reported at this time.    Are you tolerating your medication?:  Cindy Riley reports tolerating the medication.    ADHERENCE    (Below is required for Medicare Part B or Transplant patients only - per drug):     Myfortic 180 mg   Quantity filled last month: 180   # of tablets left on hand: 66    Envarsus 4 mg   Quantity filled last month: 60   # tabs left on hand: 24    Did you miss any doses in the past 4 weeks? No missed doses reported.    FINANCIAL/SHIPPING    Delivery Scheduled: Yes, Expected medication delivery date: Tues, Oct 16     Cindy Riley did not have any additional questions at this time.    Delivery address validated in [facility] scheduling system: Yes, address listed in [facility] is correct.    We will follow up with patient monthly for standard refill processing and delivery.      Thank you,  Tawanna Solo [facility] Specialty Pharmacist

## 2017-08-11 LAB — CBC W/ DIFFERENTIAL
BANDED NEUTROPHILS ABSOLUTE COUNT: 0.1 10*3/uL (ref 0.0–0.1)
BASOPHILS ABSOLUTE COUNT: 0 10*3/uL (ref 0.0–0.2)
BASOPHILS RELATIVE PERCENT: 0 %
EOSINOPHILS ABSOLUTE COUNT: 0.1 10*3/uL (ref 0.0–0.4)
EOSINOPHILS RELATIVE PERCENT: 2 %
HEMATOCRIT: 34.8 % (ref 34.0–46.6)
HEMOGLOBIN: 11.5 g/dL (ref 11.1–15.9)
LYMPHOCYTES ABSOLUTE COUNT: 1.3 10*3/uL (ref 0.7–3.1)
LYMPHOCYTES RELATIVE PERCENT: 28 %
MEAN CORPUSCULAR HEMOGLOBIN CONC: 33 g/dL (ref 31.5–35.7)
MEAN CORPUSCULAR VOLUME: 85 fL (ref 79–97)
MONOCYTES ABSOLUTE COUNT: 0.7 10*3/uL (ref 0.1–0.9)
MONOCYTES RELATIVE PERCENT: 14 %
NEUTROPHILS ABSOLUTE COUNT: 2.5 10*3/uL (ref 1.4–7.0)
NEUTROPHILS RELATIVE PERCENT: 54 %
PLATELET COUNT: 205 10*3/uL (ref 150–379)
RED BLOOD CELL COUNT: 4.11 x10E6/uL (ref 3.77–5.28)
RED CELL DISTRIBUTION WIDTH: 14.3 % (ref 12.3–15.4)
WHITE BLOOD CELL COUNT: 4.6 10*3/uL (ref 3.4–10.8)

## 2017-08-11 LAB — BASIC METABOLIC PANEL
CALCIUM: 10.1 mg/dL (ref 8.7–10.3)
CHLORIDE: 105 mmol/L (ref 96–106)
CO2: 21 mmol/L (ref 20–29)
GLUCOSE: 122 mg/dL — ABNORMAL HIGH (ref 65–99)
POTASSIUM: 3.9 mmol/L (ref 3.5–5.2)
SODIUM: 135 mmol/L (ref 134–144)

## 2017-08-11 LAB — RED CELL DISTRIBUTION WIDTH: Lab: 14.3

## 2017-08-11 LAB — PHOSPHORUS, SERUM: Lab: 3.2

## 2017-08-11 LAB — MAGNESIUM: Lab: 1.7

## 2017-08-11 LAB — BUN / CREAT RATIO: Lab: 20

## 2017-08-12 LAB — TACROLIMUS BLOOD: Lab: 9

## 2017-08-14 MED FILL — ENVARSUS XR/4MG/TAB: ENVARSUS XR/4MG/TAB | 30 days supply | Qty: 60 | Fill #3

## 2017-08-14 MED FILL — MYFORTIC/180MG/TAB: MYFORTIC/180MG/TAB | 30 days supply | Qty: 180 | Fill #4

## 2017-08-18 MED ORDER — ENVARSUS XR 4 MG TABLET,EXTENDED RELEASE
ORAL_TABLET | Freq: Every day | ORAL | 11 refills | 0 days
Start: 2017-08-18 — End: 2017-09-13

## 2017-08-18 MED ORDER — ENVARSUS XR 1 MG TABLET,EXTENDED RELEASE
ORAL_TABLET | Freq: Every day | ORAL | 0 refills | 0.00000 days | Status: CP
Start: 2017-08-18 — End: 2017-08-18

## 2017-08-19 NOTE — Unmapped (Signed)
Pt advised to decrease Envarsus to 7 mg daily

## 2017-08-22 ENCOUNTER — Telehealth (INDEPENDENT_AMBULATORY_CARE_PROVIDER_SITE_OTHER): Payer: Self-pay | Admitting: Physical Medicine and Rehabilitation

## 2017-08-22 MED ORDER — TACROLIMUS ER 1 MG TABLET,EXTENDED RELEASE 24 HR
ORAL_TABLET | Freq: Every day | ORAL | 3 refills | 0 days
Start: 2017-08-22 — End: 2017-09-12

## 2017-08-22 NOTE — Telephone Encounter (Signed)
Scheduled for 10/31 with driver.

## 2017-08-22 NOTE — Telephone Encounter (Signed)
Bilateral L4 transforaminal

## 2017-08-31 ENCOUNTER — Ambulatory Visit (INDEPENDENT_AMBULATORY_CARE_PROVIDER_SITE_OTHER): Payer: Medicare Other

## 2017-08-31 ENCOUNTER — Ambulatory Visit (INDEPENDENT_AMBULATORY_CARE_PROVIDER_SITE_OTHER): Payer: Commercial Managed Care - PPO | Admitting: Physical Medicine and Rehabilitation

## 2017-08-31 VITALS — BP 136/81 | Temp 98.4°F | Resp 80

## 2017-08-31 DIAGNOSIS — M5116 Intervertebral disc disorders with radiculopathy, lumbar region: Secondary | ICD-10-CM

## 2017-08-31 LAB — CBC W/ DIFFERENTIAL
BANDED NEUTROPHILS ABSOLUTE COUNT: 0.2 10*3/uL — ABNORMAL HIGH (ref 0.0–0.1)
BASOPHILS RELATIVE PERCENT: 0 %
EOSINOPHILS ABSOLUTE COUNT: 0.1 10*3/uL (ref 0.0–0.4)
EOSINOPHILS RELATIVE PERCENT: 1 %
HEMATOCRIT: 31.4 % — ABNORMAL LOW (ref 34.0–46.6)
HEMOGLOBIN: 10.4 g/dL — ABNORMAL LOW (ref 11.1–15.9)
IMMATURE GRANULOCYTES: 4 %
LYMPHOCYTES ABSOLUTE COUNT: 0.7 10*3/uL (ref 0.7–3.1)
LYMPHOCYTES RELATIVE PERCENT: 18 %
MEAN CORPUSCULAR HEMOGLOBIN CONC: 33.1 g/dL (ref 31.5–35.7)
MEAN CORPUSCULAR HEMOGLOBIN: 27.7 pg (ref 26.6–33.0)
MEAN CORPUSCULAR VOLUME: 84 fL (ref 79–97)
MONOCYTES ABSOLUTE COUNT: 0.5 10*3/uL (ref 0.1–0.9)
MONOCYTES RELATIVE PERCENT: 13 %
NEUTROPHILS ABSOLUTE COUNT: 2.4 10*3/uL (ref 1.4–7.0)
NEUTROPHILS RELATIVE PERCENT: 64 %
PLATELET COUNT: 179 10*3/uL (ref 150–379)
RED BLOOD CELL COUNT: 3.76 x10E6/uL — ABNORMAL LOW (ref 3.77–5.28)
RED CELL DISTRIBUTION WIDTH: 14.7 % (ref 12.3–15.4)
WHITE BLOOD CELL COUNT: 3.9 10*3/uL (ref 3.4–10.8)

## 2017-08-31 LAB — CALCIUM: Lab: 10

## 2017-08-31 LAB — MAGNESIUM: Lab: 1.5 — ABNORMAL LOW

## 2017-08-31 LAB — BASIC METABOLIC PANEL
BLOOD UREA NITROGEN: 22 mg/dL (ref 8–27)
CALCIUM: 10 mg/dL (ref 8.7–10.3)
CO2: 23 mmol/L (ref 20–29)
GLUCOSE: 102 mg/dL — ABNORMAL HIGH (ref 65–99)
POTASSIUM: 3.9 mmol/L (ref 3.5–5.2)
SODIUM: 136 mmol/L (ref 134–144)

## 2017-08-31 LAB — PHOSPHORUS, SERUM: Lab: 3.6

## 2017-08-31 LAB — LIPID PANEL
CHOLESTEROL, TOTAL: 139 mg/dL (ref 100–199)
HDL CHOLESTEROL: 43 mg/dL
LDL CHOLESTEROL CALCULATED: 69 mg/dL (ref 0–99)

## 2017-08-31 LAB — RED BLOOD CELL COUNT: Lab: 3.76 — ABNORMAL LOW

## 2017-08-31 LAB — LDL/HDL RATIO: Lab: 1.6

## 2017-08-31 MED ORDER — LIDOCAINE HCL (PF) 1 % IJ SOLN
2.0000 mL | Freq: Once | INTRAMUSCULAR | Status: AC
  Administered 2017-08-31: 2 mL

## 2017-08-31 MED ORDER — BETAMETHASONE SOD PHOS & ACET 6 (3-3) MG/ML IJ SUSP
12.0000 mg | Freq: Once | INTRAMUSCULAR | Status: AC
  Administered 2017-08-31: 12 mg

## 2017-08-31 NOTE — Patient Instructions (Signed)

## 2017-08-31 NOTE — Progress Notes (Deleted)
Pt is here for lower back injection, pt having some pain in lower back, radiates to hip. Left side is worse, very little pain on the right. No numbness or tingling. Sitting to long causes the pain or standing to long triggers the pain. Pt has to get up to walk every 60mins to and hour to help with the pain or keep from getting bad. Pt taking tylenol and muscle relaxer when in pain. Has driver, taking baby aspirin today.

## 2017-09-01 LAB — TACROLIMUS BLOOD: Lab: 5.2

## 2017-09-08 NOTE — Procedures (Signed)
Linda Gilmore is a 61 year old female with chronic worsening left-sided low back pain can refer into the hip.  Pain is worse with standing and sitting.  She does have both moderate narrowing of the canal with a particularly disc herniation and extrusion at L2-3 which is very small but also with pretty significant annular tear at L5-S1 which approximates the S1 nerve root..  No focal nerve compression.  Prior interlaminar injection gave her relief for about 5 days and now her symptoms are actually worse than before.  I think the next best approach is transforaminal epidural injection to see if that would help longer.  Another avenue of treatment would be intradiscal injection.  Could be having some facet mediated pain but is behaving more like the disc problem.  She has had good conservative care otherwise.  Please see our prior evaluation and management note for further details and justification.  S1 Lumbosacral Transforaminal Epidural Steroid Injection - Sub-Pedicular Approach with Fluoroscopic Guidance   Patient: Linda Gilmore      Date of Birth: 17-Jan-1956 MRN: [id number] PCP: Darcus Austin, MD      Visit Date: 08/31/2017   Universal Protocol:    Date/Time: 11/08/186:03 AM  Consent Given By: the patient  Position:  PRONE  Additional Comments: Vital signs were monitored before and after the procedure. Patient was prepped and draped in the usual sterile fashion. The correct patient, procedure, and site was verified.   Injection Procedure Details:  Procedure Site One Meds Administered:  Meds ordered this encounter  Medications  . lidocaine (PF) (XYLOCAINE) 1 % injection 2 mL  . betamethasone acetate-betamethasone sodium phosphate (CELESTONE) injection 12 mg    Laterality: Left  Location/Site:  S1 Foramen   Needle size: 22 G  Needle type: Spinal  Needle Placement: Transforaminal  Findings:  -Contrast Used: 1 mL iohexol 180 mg iodine/mL   -Comments: Excellent flow of  contrast along the nerve and into the epidural space.  Procedure Details: After squaring off the sacral end-plate to get a true AP view, the C-arm was positioned so that the best possible view of the S1 foramen was visualized. The soft tissues overlying this structure were infiltrated with 2-3 ml. of 1% Lidocaine without Epinephrine.    The spinal needle was inserted toward the target using a "trajectory" view along the fluoroscope beam.  Under AP and lateral visualization, the needle was advanced so it did not puncture dura. Biplanar projections were used to confirm position. Aspiration was confirmed to be negative for CSF and/or blood. A 1-2 ml. volume of Isovue-250 was injected and flow of contrast was noted at each level. Radiographs were obtained for documentation purposes.   After attaining the desired flow of contrast documented above, a 0.5 to 1.0 ml test dose of 0.25% Marcaine was injected into each respective transforaminal space.  The patient was observed for 90 seconds post injection.  After no sensory deficits were reported, and normal lower extremity motor function was noted,   the above injectate was administered so that equal amounts of the injectate were placed at each foramen (level) into the transforaminal epidural space.   Additional Comments:  The patient tolerated the procedure well Dressing: Band-Aid    Post-procedure details: Patient was observed during the procedure. Post-procedure instructions were reviewed.  Patient left the clinic in stable condition.

## 2017-09-12 MED ORDER — MYFORTIC 180 MG TABLET,DELAYED RELEASE
ORAL_TABLET | Freq: Two times a day (BID) | ORAL | 3 refills | 0.00000 days | Status: CP
Start: 2017-09-12 — End: 2017-09-12

## 2017-09-12 MED ORDER — MYFORTIC 180 MG TABLET,DELAYED RELEASE: tablet | 3 refills | 0 days

## 2017-09-12 MED ORDER — ENVARSUS XR 1 MG TABLET,EXTENDED RELEASE
ORAL_TABLET | Freq: Every day | ORAL | 3 refills | 0.00000 days | Status: CP
Start: 2017-09-12 — End: 2017-09-13

## 2017-09-13 MED ORDER — MYFORTIC 180 MG TABLET,DELAYED RELEASE
ORAL_TABLET | Freq: Two times a day (BID) | ORAL | 3 refills | 0.00000 days | Status: CP
Start: 2017-09-13 — End: 2017-09-15

## 2017-09-13 MED ORDER — ENVARSUS XR 1 MG TABLET,EXTENDED RELEASE
ORAL_TABLET | Freq: Every day | ORAL | 3 refills | 0 days | Status: CP
Start: 2017-09-13 — End: 2017-09-15

## 2017-09-13 NOTE — Unmapped (Signed)
Myfortic and Envarsus, Required Specialty Pharmacy per insurance: [facility]

## 2017-09-15 MED ORDER — MYCOPHENOLATE SODIUM 180 MG TABLET,DELAYED RELEASE
ORAL_TABLET | Freq: Two times a day (BID) | ORAL | 3 refills | 0.00000 days | Status: CP
Start: 2017-09-15 — End: 2018-03-01

## 2017-09-15 MED ORDER — ENVARSUS XR 1 MG TABLET,EXTENDED RELEASE
ORAL_TABLET | Freq: Every day | ORAL | 3 refills | 0 days | Status: CP
Start: 2017-09-15 — End: 2018-03-01

## 2017-09-15 MED ORDER — MYFORTIC 180 MG TABLET,DELAYED RELEASE
ORAL_TABLET | Freq: Two times a day (BID) | ORAL | 0 refills | 0 days | Status: CP
Start: 2017-09-15 — End: 2017-11-09

## 2017-09-16 ENCOUNTER — Telehealth (INDEPENDENT_AMBULATORY_CARE_PROVIDER_SITE_OTHER): Payer: Self-pay

## 2017-09-16 NOTE — Telephone Encounter (Signed)
Call patient and asked her to return my call.

## 2017-10-26 ENCOUNTER — Telehealth (INDEPENDENT_AMBULATORY_CARE_PROVIDER_SITE_OTHER): Payer: Self-pay | Admitting: Orthopaedic Surgery

## 2017-10-26 NOTE — Telephone Encounter (Signed)
Patient scheduled an appointment for 11/07/16 due to back pain.   Patient would like another prescription for Methocarbamol and Tramadol to be called into [facility] on [address].  CB#[phone]

## 2017-10-27 NOTE — Telephone Encounter (Signed)
Please advise 

## 2017-10-28 NOTE — Telephone Encounter (Signed)
OK for both

## 2017-11-01 HISTORY — PX: RENAL BIOPSY: SHX156

## 2017-11-02 ENCOUNTER — Other Ambulatory Visit (INDEPENDENT_AMBULATORY_CARE_PROVIDER_SITE_OTHER): Payer: Self-pay

## 2017-11-02 MED ORDER — METHOCARBAMOL 500 MG PO TABS: 500.0000 mg | ORAL_TABLET | Freq: Two times a day (BID) | ORAL | 0 refills | Status: DC | PRN

## 2017-11-02 MED ORDER — TRAMADOL HCL 50 MG PO TABS: 50.0000 mg | ORAL_TABLET | Freq: Two times a day (BID) | ORAL | 0 refills | Status: DC | PRN

## 2017-11-03 ENCOUNTER — Other Ambulatory Visit (INDEPENDENT_AMBULATORY_CARE_PROVIDER_SITE_OTHER): Payer: Self-pay

## 2017-11-03 ENCOUNTER — Telehealth (INDEPENDENT_AMBULATORY_CARE_PROVIDER_SITE_OTHER): Payer: Self-pay | Admitting: Orthopaedic Surgery

## 2017-11-03 MED ORDER — TRAMADOL HCL 50 MG PO TABS: 50.0000 mg | ORAL_TABLET | Freq: Two times a day (BID) | ORAL | 0 refills | Status: DC | PRN

## 2017-11-03 MED ORDER — METHOCARBAMOL 500 MG PO TABS: 500.0000 mg | ORAL_TABLET | Freq: Two times a day (BID) | ORAL | 0 refills | Status: DC | PRN

## 2017-11-03 NOTE — Telephone Encounter (Signed)
Re faxed on 1/3/[phone]

## 2017-11-03 NOTE — Telephone Encounter (Signed)
Patient left a message stating [facility] does not have the two medications that she requested on 10/26/17 (Methocarbamol and Tramadol).  CB# [phone]

## 2017-11-05 DIAGNOSIS — N390 Urinary tract infection, site not specified: Secondary | ICD-10-CM | POA: Diagnosis not present

## 2017-11-07 ENCOUNTER — Ambulatory Visit (INDEPENDENT_AMBULATORY_CARE_PROVIDER_SITE_OTHER): Payer: Self-pay

## 2017-11-07 ENCOUNTER — Ambulatory Visit (INDEPENDENT_AMBULATORY_CARE_PROVIDER_SITE_OTHER): Payer: Commercial Managed Care - PPO | Admitting: Orthopaedic Surgery

## 2017-11-07 ENCOUNTER — Encounter (INDEPENDENT_AMBULATORY_CARE_PROVIDER_SITE_OTHER): Payer: Self-pay | Admitting: Orthopaedic Surgery

## 2017-11-07 DIAGNOSIS — M545 Low back pain, unspecified: Secondary | ICD-10-CM

## 2017-11-07 DIAGNOSIS — G8929 Other chronic pain: Secondary | ICD-10-CM
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
## 2017-11-09 ENCOUNTER — Encounter: Admit: 2017-11-09 | Discharge: 2017-11-09 | Payer: PRIVATE HEALTH INSURANCE

## 2017-11-09 ENCOUNTER — Ambulatory Visit
Admit: 2017-11-09 | Discharge: 2017-11-09 | Payer: PRIVATE HEALTH INSURANCE | Attending: Nephrology | Primary: Nephrology

## 2017-11-09 DIAGNOSIS — Z0000 Encounter for general adult medical examination without abnormal findings: Secondary | ICD-10-CM

## 2017-11-09 DIAGNOSIS — M810 Age-related osteoporosis without current pathological fracture: Principal | ICD-10-CM

## 2017-11-09 DIAGNOSIS — Z940 Kidney transplant status: Principal | ICD-10-CM

## 2017-11-09 DIAGNOSIS — Z114 Encounter for screening for human immunodeficiency virus [HIV]: Secondary | ICD-10-CM

## 2017-11-09 DIAGNOSIS — Z6826 Body mass index (BMI) 26.0-26.9, adult: Secondary | ICD-10-CM | POA: Diagnosis not present

## 2017-11-09 MED ORDER — CIPROFLOXACIN 500 MG TABLET
ORAL_TABLET | Freq: Two times a day (BID) | ORAL | 0 refills | 0.00000 days | Status: CP
Start: 2017-11-09 — End: 2017-11-23

## 2017-12-05 ENCOUNTER — Encounter: Admit: 2017-12-05 | Discharge: 2017-12-06 | Payer: PRIVATE HEALTH INSURANCE

## 2017-12-05 ENCOUNTER — Ambulatory Visit: Admit: 2017-12-05 | Discharge: 2017-12-06 | Payer: PRIVATE HEALTH INSURANCE

## 2017-12-05 DIAGNOSIS — N179 Acute kidney failure, unspecified: Principal | ICD-10-CM

## 2017-12-05 DIAGNOSIS — T451X5A Adverse effect of antineoplastic and immunosuppressive drugs, initial encounter: Secondary | ICD-10-CM | POA: Diagnosis not present

## 2017-12-05 DIAGNOSIS — M1990 Unspecified osteoarthritis, unspecified site: Secondary | ICD-10-CM | POA: Diagnosis not present

## 2017-12-05 DIAGNOSIS — K2940 Chronic atrophic gastritis without bleeding: Secondary | ICD-10-CM | POA: Diagnosis not present

## 2017-12-05 DIAGNOSIS — M311 Thrombotic microangiopathy: Secondary | ICD-10-CM | POA: Diagnosis not present

## 2017-12-05 DIAGNOSIS — Z7982 Long term (current) use of aspirin: Secondary | ICD-10-CM | POA: Diagnosis not present

## 2017-12-05 DIAGNOSIS — R7989 Other specified abnormal findings of blood chemistry: Secondary | ICD-10-CM | POA: Diagnosis not present

## 2017-12-05 DIAGNOSIS — K6389 Other specified diseases of intestine: Secondary | ICD-10-CM | POA: Diagnosis not present

## 2017-12-05 DIAGNOSIS — I10 Essential (primary) hypertension: Secondary | ICD-10-CM | POA: Diagnosis not present

## 2017-12-05 DIAGNOSIS — Z940 Kidney transplant status: Secondary | ICD-10-CM | POA: Diagnosis not present

## 2017-12-05 DIAGNOSIS — Q613 Polycystic kidney, unspecified: Secondary | ICD-10-CM | POA: Diagnosis not present

## 2017-12-05 DIAGNOSIS — Z8673 Personal history of transient ischemic attack (TIA), and cerebral infarction without residual deficits: Secondary | ICD-10-CM | POA: Diagnosis not present

## 2017-12-05 DIAGNOSIS — T8619 Other complication of kidney transplant: Secondary | ICD-10-CM | POA: Diagnosis not present

## 2017-12-05 MED ORDER — ATORVASTATIN 10 MG TABLET
ORAL_TABLET | Freq: Every evening | ORAL | 3 refills | 0.00000 days | Status: CP
Start: 2017-12-05 — End: 2018-12-11

## 2017-12-13 DIAGNOSIS — D899 Disorder involving the immune mechanism, unspecified: Secondary | ICD-10-CM | POA: Diagnosis not present

## 2017-12-13 DIAGNOSIS — Z940 Kidney transplant status: Secondary | ICD-10-CM | POA: Diagnosis not present

## 2017-12-13 DIAGNOSIS — M545 Low back pain: Secondary | ICD-10-CM | POA: Diagnosis not present

## 2017-12-19 DIAGNOSIS — D899 Disorder involving the immune mechanism, unspecified: Secondary | ICD-10-CM | POA: Diagnosis not present

## 2017-12-19 DIAGNOSIS — Z940 Kidney transplant status: Secondary | ICD-10-CM | POA: Diagnosis not present

## 2017-12-20 DIAGNOSIS — M545 Low back pain: Secondary | ICD-10-CM | POA: Diagnosis not present

## 2017-12-26 DIAGNOSIS — Z940 Kidney transplant status: Secondary | ICD-10-CM | POA: Diagnosis not present

## 2017-12-26 DIAGNOSIS — D899 Disorder involving the immune mechanism, unspecified: Secondary | ICD-10-CM | POA: Diagnosis not present

## 2017-12-27 DIAGNOSIS — M545 Low back pain: Secondary | ICD-10-CM | POA: Diagnosis not present

## 2018-01-02 DIAGNOSIS — M545 Low back pain: Secondary | ICD-10-CM | POA: Diagnosis not present

## 2018-01-02 DIAGNOSIS — D899 Disorder involving the immune mechanism, unspecified: Secondary | ICD-10-CM | POA: Diagnosis not present

## 2018-01-02 DIAGNOSIS — Z940 Kidney transplant status: Secondary | ICD-10-CM | POA: Diagnosis not present

## 2018-01-09 ENCOUNTER — Other Ambulatory Visit (HOSPITAL_COMMUNITY)
Admission: RE | Admit: 2018-01-09 | Discharge: 2018-01-09 | Disposition: A | Discharge status: Home / Self Care | Payer: BLUE CROSS/BLUE SHIELD | Source: Ambulatory Visit | Attending: Obstetrics & Gynecology | Admitting: Obstetrics & Gynecology

## 2018-01-09 ENCOUNTER — Other Ambulatory Visit: Payer: Self-pay

## 2018-01-09 ENCOUNTER — Encounter: Payer: Self-pay | Admitting: Obstetrics & Gynecology

## 2018-01-09 ENCOUNTER — Ambulatory Visit (INDEPENDENT_AMBULATORY_CARE_PROVIDER_SITE_OTHER): Payer: BLUE CROSS/BLUE SHIELD | Admitting: Obstetrics & Gynecology

## 2018-01-09 VITALS — BP 142/96 | HR 72 | Resp 16 | Ht 66.5 in | Wt 169.0 lb

## 2018-01-09 DIAGNOSIS — Z124 Encounter for screening for malignant neoplasm of cervix: Secondary | ICD-10-CM | POA: Insufficient documentation

## 2018-01-09 DIAGNOSIS — Z01419 Encounter for gynecological examination (general) (routine) without abnormal findings: Secondary | ICD-10-CM | POA: Diagnosis not present

## 2018-01-09 DIAGNOSIS — D899 Disorder involving the immune mechanism, unspecified: Secondary | ICD-10-CM | POA: Diagnosis not present

## 2018-01-09 DIAGNOSIS — Z940 Kidney transplant status: Secondary | ICD-10-CM | POA: Diagnosis not present

## 2018-01-09 MED ORDER — LIDOCAINE 5 % EX OINT: 1.0000 "application " | TOPICAL_OINTMENT | CUTANEOUS | 0 refills | Status: DC | PRN

## 2018-01-09 MED ORDER — HYDROCORTISONE 2.5 % RE CREA: 1.0000 "application " | TOPICAL_CREAM | Freq: Four times a day (QID) | RECTAL | 1 refills | Status: DC
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
## 2018-01-11 DIAGNOSIS — M545 Low back pain: Secondary | ICD-10-CM | POA: Diagnosis not present

## 2018-01-11 LAB — CYTOLOGY - PAP: Diagnosis: NEGATIVE

## 2018-01-16 DIAGNOSIS — M545 Low back pain: Secondary | ICD-10-CM | POA: Diagnosis not present

## 2018-01-16 DIAGNOSIS — D899 Disorder involving the immune mechanism, unspecified: Secondary | ICD-10-CM | POA: Diagnosis not present

## 2018-01-16 DIAGNOSIS — Z940 Kidney transplant status: Secondary | ICD-10-CM | POA: Diagnosis not present

## 2018-01-17 ENCOUNTER — Ambulatory Visit: Payer: Self-pay | Admitting: Obstetrics & Gynecology

## 2018-01-17 DIAGNOSIS — M545 Low back pain: Secondary | ICD-10-CM | POA: Diagnosis not present

## 2018-01-20 ENCOUNTER — Ambulatory Visit: Payer: Self-pay | Admitting: Obstetrics & Gynecology

## 2018-01-23 DIAGNOSIS — M545 Low back pain: Secondary | ICD-10-CM | POA: Diagnosis not present

## 2018-01-30 DIAGNOSIS — Z940 Kidney transplant status: Secondary | ICD-10-CM | POA: Diagnosis not present

## 2018-01-30 DIAGNOSIS — D899 Disorder involving the immune mechanism, unspecified: Secondary | ICD-10-CM | POA: Diagnosis not present

## 2018-02-06 DIAGNOSIS — Z940 Kidney transplant status: Secondary | ICD-10-CM | POA: Diagnosis not present

## 2018-02-06 DIAGNOSIS — D899 Disorder involving the immune mechanism, unspecified: Secondary | ICD-10-CM | POA: Diagnosis not present

## 2018-02-13 DIAGNOSIS — D899 Disorder involving the immune mechanism, unspecified: Secondary | ICD-10-CM | POA: Diagnosis not present

## 2018-02-13 DIAGNOSIS — Z940 Kidney transplant status: Secondary | ICD-10-CM | POA: Diagnosis not present

## 2018-02-21 DIAGNOSIS — D899 Disorder involving the immune mechanism, unspecified: Secondary | ICD-10-CM | POA: Diagnosis not present

## 2018-02-21 DIAGNOSIS — Z940 Kidney transplant status: Secondary | ICD-10-CM | POA: Diagnosis not present

## 2018-03-01 ENCOUNTER — Encounter: Admit: 2018-03-01 | Discharge: 2018-03-01 | Payer: PRIVATE HEALTH INSURANCE

## 2018-03-01 ENCOUNTER — Ambulatory Visit
Admit: 2018-03-01 | Discharge: 2018-03-01 | Payer: PRIVATE HEALTH INSURANCE | Attending: Nephrology | Primary: Nephrology

## 2018-03-01 DIAGNOSIS — D899 Disorder involving the immune mechanism, unspecified: Secondary | ICD-10-CM

## 2018-03-01 DIAGNOSIS — Z940 Kidney transplant status: Principal | ICD-10-CM

## 2018-03-01 DIAGNOSIS — F329 Major depressive disorder, single episode, unspecified: Secondary | ICD-10-CM | POA: Diagnosis not present

## 2018-03-01 DIAGNOSIS — D649 Anemia, unspecified: Secondary | ICD-10-CM | POA: Diagnosis not present

## 2018-03-01 DIAGNOSIS — Z882 Allergy status to sulfonamides status: Secondary | ICD-10-CM | POA: Diagnosis not present

## 2018-03-01 DIAGNOSIS — Z7982 Long term (current) use of aspirin: Secondary | ICD-10-CM | POA: Diagnosis not present

## 2018-03-01 DIAGNOSIS — Z886 Allergy status to analgesic agent status: Secondary | ICD-10-CM | POA: Diagnosis not present

## 2018-03-01 DIAGNOSIS — I10 Essential (primary) hypertension: Secondary | ICD-10-CM | POA: Diagnosis not present

## 2018-03-01 DIAGNOSIS — Z79899 Other long term (current) drug therapy: Secondary | ICD-10-CM | POA: Diagnosis not present

## 2018-03-01 DIAGNOSIS — Z6826 Body mass index (BMI) 26.0-26.9, adult: Secondary | ICD-10-CM | POA: Diagnosis not present

## 2018-03-01 DIAGNOSIS — R51 Headache: Secondary | ICD-10-CM | POA: Diagnosis not present

## 2018-03-01 MED ORDER — ENVARSUS XR 1 MG TABLET,EXTENDED RELEASE: 5 mg | tablet | Freq: Every day | 11 refills | 0 days | Status: AC

## 2018-03-01 MED ORDER — MYCOPHENOLATE SODIUM 360 MG TABLET,DELAYED RELEASE
ORAL_TABLET | Freq: Two times a day (BID) | ORAL | 3 refills | 0.00000 days | Status: CP
Start: 2018-03-01 — End: 2018-03-06

## 2018-03-01 MED ORDER — SIROLIMUS 1 MG TABLET
ORAL_TABLET | Freq: Every day | ORAL | 2 refills | 0.00000 days | Status: CP
Start: 2018-03-01 — End: 2018-03-01

## 2018-03-01 MED ORDER — SIROLIMUS 1 MG TABLET: 2 mg | tablet | Freq: Every day | 2 refills | 0 days

## 2018-03-01 MED ORDER — ENVARSUS XR 1 MG TABLET,EXTENDED RELEASE
ORAL_TABLET | Freq: Every day | ORAL | 11 refills | 0.00000 days | Status: CP
Start: 2018-03-01 — End: 2018-03-15

## 2018-03-06 MED ORDER — MYFORTIC 360 MG TABLET,DELAYED RELEASE
ORAL_TABLET | Freq: Two times a day (BID) | ORAL | 3 refills | 0 days | Status: CP
Start: 2018-03-06 — End: 2018-05-09

## 2018-03-09 DIAGNOSIS — D899 Disorder involving the immune mechanism, unspecified: Secondary | ICD-10-CM | POA: Diagnosis not present

## 2018-03-09 DIAGNOSIS — Z940 Kidney transplant status: Secondary | ICD-10-CM | POA: Diagnosis not present

## 2018-03-13 MED ORDER — SIROLIMUS 1 MG TABLET
ORAL_TABLET | Freq: Every day | ORAL | 2 refills | 0.00000 days
Start: 2018-03-13 — End: 2018-03-30

## 2018-03-13 MED ORDER — ENVARSUS XR 1 MG TABLET,EXTENDED RELEASE
ORAL_TABLET | Freq: Every day | ORAL | 11 refills | 0.00000 days
Start: 2018-03-13 — End: 2018-03-30

## 2018-03-15 ENCOUNTER — Encounter: Admit: 2018-03-15 | Discharge: 2018-03-15 | Payer: PRIVATE HEALTH INSURANCE

## 2018-03-15 ENCOUNTER — Ambulatory Visit
Admit: 2018-03-15 | Discharge: 2018-03-15 | Payer: PRIVATE HEALTH INSURANCE | Attending: Nephrology | Primary: Nephrology

## 2018-03-15 DIAGNOSIS — Z940 Kidney transplant status: Principal | ICD-10-CM

## 2018-03-15 DIAGNOSIS — D899 Disorder involving the immune mechanism, unspecified: Secondary | ICD-10-CM

## 2018-03-15 DIAGNOSIS — Z6826 Body mass index (BMI) 26.0-26.9, adult: Secondary | ICD-10-CM | POA: Diagnosis not present

## 2018-03-18 DIAGNOSIS — R197 Diarrhea, unspecified: Secondary | ICD-10-CM | POA: Diagnosis not present

## 2018-03-28 DIAGNOSIS — Z940 Kidney transplant status: Secondary | ICD-10-CM | POA: Diagnosis not present

## 2018-03-28 DIAGNOSIS — K5730 Diverticulosis of large intestine without perforation or abscess without bleeding: Secondary | ICD-10-CM | POA: Diagnosis not present

## 2018-03-28 DIAGNOSIS — R194 Change in bowel habit: Secondary | ICD-10-CM | POA: Diagnosis not present

## 2018-03-28 DIAGNOSIS — D899 Disorder involving the immune mechanism, unspecified: Secondary | ICD-10-CM | POA: Diagnosis not present

## 2018-03-28 DIAGNOSIS — Z1211 Encounter for screening for malignant neoplasm of colon: Secondary | ICD-10-CM | POA: Diagnosis not present

## 2018-03-30 MED ORDER — SIROLIMUS 1 MG TABLET
ORAL_TABLET | Freq: Every day | ORAL | 3 refills | 0 days | Status: CP
Start: 2018-03-30 — End: 2018-04-26

## 2018-04-03 DIAGNOSIS — Z940 Kidney transplant status: Secondary | ICD-10-CM | POA: Diagnosis not present

## 2018-04-03 DIAGNOSIS — D899 Disorder involving the immune mechanism, unspecified: Secondary | ICD-10-CM | POA: Diagnosis not present

## 2018-04-10 DIAGNOSIS — D899 Disorder involving the immune mechanism, unspecified: Secondary | ICD-10-CM | POA: Diagnosis not present

## 2018-04-10 DIAGNOSIS — Z940 Kidney transplant status: Secondary | ICD-10-CM | POA: Diagnosis not present

## 2018-04-12 ENCOUNTER — Encounter: Admit: 2018-04-12 | Discharge: 2018-04-12 | Payer: PRIVATE HEALTH INSURANCE

## 2018-04-12 ENCOUNTER — Encounter
Admit: 2018-04-12 | Discharge: 2018-04-12 | Payer: PRIVATE HEALTH INSURANCE | Attending: Nephrology | Primary: Nephrology

## 2018-04-12 DIAGNOSIS — Z940 Kidney transplant status: Principal | ICD-10-CM

## 2018-04-12 DIAGNOSIS — Z0000 Encounter for general adult medical examination without abnormal findings: Secondary | ICD-10-CM

## 2018-04-12 DIAGNOSIS — F329 Major depressive disorder, single episode, unspecified: Secondary | ICD-10-CM | POA: Diagnosis not present

## 2018-04-12 DIAGNOSIS — M1990 Unspecified osteoarthritis, unspecified site: Secondary | ICD-10-CM | POA: Diagnosis not present

## 2018-04-12 DIAGNOSIS — Z6826 Body mass index (BMI) 26.0-26.9, adult: Secondary | ICD-10-CM | POA: Diagnosis not present

## 2018-04-12 DIAGNOSIS — Z7982 Long term (current) use of aspirin: Secondary | ICD-10-CM | POA: Diagnosis not present

## 2018-04-12 DIAGNOSIS — Z8673 Personal history of transient ischemic attack (TIA), and cerebral infarction without residual deficits: Secondary | ICD-10-CM | POA: Diagnosis not present

## 2018-04-12 DIAGNOSIS — I10 Essential (primary) hypertension: Secondary | ICD-10-CM | POA: Diagnosis not present

## 2018-04-17 MED ORDER — AMLODIPINE 2.5 MG TABLET
ORAL_TABLET | Freq: Every day | ORAL | 3 refills | 0 days | Status: CP
Start: 2018-04-17 — End: 2018-04-25

## 2018-04-24 DIAGNOSIS — D899 Disorder involving the immune mechanism, unspecified: Secondary | ICD-10-CM | POA: Diagnosis not present

## 2018-04-24 DIAGNOSIS — Z940 Kidney transplant status: Secondary | ICD-10-CM | POA: Diagnosis not present

## 2018-04-25 MED ORDER — AMLODIPINE 5 MG TABLET
ORAL_TABLET | Freq: Two times a day (BID) | ORAL | 3 refills | 0 days | Status: CP
Start: 2018-04-25 — End: 2018-05-02

## 2018-04-26 ENCOUNTER — Encounter: Payer: Self-pay | Admitting: Obstetrics & Gynecology

## 2018-04-26 DIAGNOSIS — K529 Noninfective gastroenteritis and colitis, unspecified: Secondary | ICD-10-CM | POA: Diagnosis not present

## 2018-04-26 DIAGNOSIS — K5730 Diverticulosis of large intestine without perforation or abscess without bleeding: Secondary | ICD-10-CM | POA: Diagnosis not present

## 2018-04-26 DIAGNOSIS — R194 Change in bowel habit: Secondary | ICD-10-CM | POA: Diagnosis not present

## 2018-04-26 DIAGNOSIS — Z1211 Encounter for screening for malignant neoplasm of colon: Secondary | ICD-10-CM | POA: Diagnosis not present

## 2018-04-26 MED ORDER — SIROLIMUS 1 MG TABLET
ORAL_TABLET | Freq: Every day | ORAL | 3 refills | 0.00000 days
Start: 2018-04-26 — End: 2018-05-04

## 2018-05-02 MED ORDER — AMLODIPINE 10 MG TABLET
ORAL_TABLET | Freq: Every day | ORAL | 3 refills | 0.00000 days | Status: CP
Start: 2018-05-02 — End: 2019-04-25

## 2018-05-04 MED ORDER — SIROLIMUS 1 MG TABLET
ORAL_TABLET | Freq: Every day | ORAL | 3 refills | 0 days
Start: 2018-05-04 — End: 2018-10-31

## 2018-05-09 ENCOUNTER — Ambulatory Visit
Admit: 2018-05-09 | Discharge: 2018-05-09 | Payer: PRIVATE HEALTH INSURANCE | Attending: Nephrology | Primary: Nephrology

## 2018-05-09 ENCOUNTER — Encounter: Admit: 2018-05-09 | Discharge: 2018-05-09 | Payer: PRIVATE HEALTH INSURANCE

## 2018-05-09 DIAGNOSIS — Z940 Kidney transplant status: Principal | ICD-10-CM

## 2018-05-09 DIAGNOSIS — Z0000 Encounter for general adult medical examination without abnormal findings: Secondary | ICD-10-CM

## 2018-05-09 DIAGNOSIS — M311 Thrombotic microangiopathy: Secondary | ICD-10-CM | POA: Diagnosis not present

## 2018-05-09 DIAGNOSIS — Z8673 Personal history of transient ischemic attack (TIA), and cerebral infarction without residual deficits: Secondary | ICD-10-CM | POA: Diagnosis not present

## 2018-05-09 DIAGNOSIS — D649 Anemia, unspecified: Secondary | ICD-10-CM | POA: Diagnosis not present

## 2018-05-09 DIAGNOSIS — Z7982 Long term (current) use of aspirin: Secondary | ICD-10-CM | POA: Diagnosis not present

## 2018-05-09 DIAGNOSIS — M1990 Unspecified osteoarthritis, unspecified site: Secondary | ICD-10-CM | POA: Diagnosis not present

## 2018-05-09 DIAGNOSIS — R5382 Chronic fatigue, unspecified: Secondary | ICD-10-CM | POA: Diagnosis not present

## 2018-05-09 DIAGNOSIS — R251 Tremor, unspecified: Secondary | ICD-10-CM | POA: Diagnosis not present

## 2018-05-09 DIAGNOSIS — Z6826 Body mass index (BMI) 26.0-26.9, adult: Secondary | ICD-10-CM | POA: Diagnosis not present

## 2018-05-09 DIAGNOSIS — I10 Essential (primary) hypertension: Secondary | ICD-10-CM | POA: Diagnosis not present

## 2018-05-09 DIAGNOSIS — Z882 Allergy status to sulfonamides status: Secondary | ICD-10-CM | POA: Diagnosis not present

## 2018-05-09 DIAGNOSIS — Z79899 Other long term (current) drug therapy: Secondary | ICD-10-CM | POA: Diagnosis not present

## 2018-05-09 DIAGNOSIS — Z4822 Encounter for aftercare following kidney transplant: Secondary | ICD-10-CM | POA: Diagnosis not present

## 2018-05-09 DIAGNOSIS — L853 Xerosis cutis: Secondary | ICD-10-CM | POA: Diagnosis not present

## 2018-05-09 DIAGNOSIS — Q613 Polycystic kidney, unspecified: Secondary | ICD-10-CM | POA: Diagnosis not present

## 2018-05-09 MED ORDER — MYFORTIC 360 MG TABLET,DELAYED RELEASE
ORAL_TABLET | Freq: Three times a day (TID) | ORAL | 3 refills | 0 days | Status: CP
Start: 2018-05-09 — End: 2018-08-03

## 2018-07-05 ENCOUNTER — Encounter: Payer: Self-pay | Admitting: Obstetrics & Gynecology

## 2018-07-05 ENCOUNTER — Encounter: Admit: 2018-07-05 | Discharge: 2018-07-05 | Payer: PRIVATE HEALTH INSURANCE

## 2018-07-05 ENCOUNTER — Encounter
Admit: 2018-07-05 | Discharge: 2018-07-05 | Payer: PRIVATE HEALTH INSURANCE | Attending: Nephrology | Primary: Nephrology

## 2018-07-05 DIAGNOSIS — Z0000 Encounter for general adult medical examination without abnormal findings: Secondary | ICD-10-CM

## 2018-07-05 DIAGNOSIS — Z940 Kidney transplant status: Principal | ICD-10-CM

## 2018-07-05 DIAGNOSIS — Z882 Allergy status to sulfonamides status: Secondary | ICD-10-CM | POA: Diagnosis not present

## 2018-07-05 DIAGNOSIS — Z91041 Radiographic dye allergy status: Secondary | ICD-10-CM | POA: Diagnosis not present

## 2018-07-05 DIAGNOSIS — M1990 Unspecified osteoarthritis, unspecified site: Secondary | ICD-10-CM | POA: Diagnosis not present

## 2018-07-05 DIAGNOSIS — R251 Tremor, unspecified: Secondary | ICD-10-CM | POA: Diagnosis not present

## 2018-07-05 DIAGNOSIS — Z7982 Long term (current) use of aspirin: Secondary | ICD-10-CM | POA: Diagnosis not present

## 2018-07-05 DIAGNOSIS — R5382 Chronic fatigue, unspecified: Secondary | ICD-10-CM | POA: Diagnosis not present

## 2018-07-05 DIAGNOSIS — R828 Abnormal findings on cytological and histological examination of urine: Secondary | ICD-10-CM | POA: Diagnosis not present

## 2018-07-05 DIAGNOSIS — Z8673 Personal history of transient ischemic attack (TIA), and cerebral infarction without residual deficits: Secondary | ICD-10-CM | POA: Diagnosis not present

## 2018-07-05 DIAGNOSIS — D649 Anemia, unspecified: Secondary | ICD-10-CM | POA: Diagnosis not present

## 2018-07-05 DIAGNOSIS — I7090 Unspecified atherosclerosis: Secondary | ICD-10-CM | POA: Diagnosis not present

## 2018-07-05 DIAGNOSIS — Z23 Encounter for immunization: Secondary | ICD-10-CM | POA: Diagnosis not present

## 2018-07-05 DIAGNOSIS — Z886 Allergy status to analgesic agent status: Secondary | ICD-10-CM | POA: Diagnosis not present

## 2018-07-05 DIAGNOSIS — I10 Essential (primary) hypertension: Secondary | ICD-10-CM | POA: Diagnosis not present

## 2018-07-05 DIAGNOSIS — Q613 Polycystic kidney, unspecified: Secondary | ICD-10-CM | POA: Diagnosis not present

## 2018-07-05 DIAGNOSIS — Z79899 Other long term (current) drug therapy: Secondary | ICD-10-CM | POA: Diagnosis not present

## 2018-07-18 MED ORDER — ZALEPLON 5 MG CAPSULE
ORAL_CAPSULE | Freq: Every evening | ORAL | 3 refills | 0.00000 days | Status: CP
Start: 2018-07-18 — End: 2018-11-08

## 2018-08-03 MED ORDER — MYFORTIC 360 MG TABLET,DELAYED RELEASE
ORAL_TABLET | Freq: Three times a day (TID) | ORAL | 3 refills | 0.00000 days | Status: CP
Start: 2018-08-03 — End: 2019-08-03

## 2018-08-23 DIAGNOSIS — H43391 Other vitreous opacities, right eye: Secondary | ICD-10-CM | POA: Diagnosis not present

## 2018-10-31 MED ORDER — SIROLIMUS 1 MG TABLET
ORAL_TABLET | Freq: Every day | ORAL | 3 refills | 0 days | Status: CP
Start: 2018-10-31 — End: 2018-10-31

## 2018-10-31 MED ORDER — SIROLIMUS 1 MG TABLET: 3 mg | tablet | Freq: Every day | 3 refills | 0 days | Status: AC

## 2018-11-08 ENCOUNTER — Encounter: Admit: 2018-11-08 | Discharge: 2018-11-08 | Payer: PRIVATE HEALTH INSURANCE

## 2018-11-08 ENCOUNTER — Ambulatory Visit
Admit: 2018-11-08 | Discharge: 2018-11-08 | Payer: PRIVATE HEALTH INSURANCE | Attending: Nephrology | Primary: Nephrology

## 2018-11-08 DIAGNOSIS — Z940 Kidney transplant status: Principal | ICD-10-CM

## 2018-11-08 DIAGNOSIS — Z0000 Encounter for general adult medical examination without abnormal findings: Secondary | ICD-10-CM

## 2018-11-08 DIAGNOSIS — E119 Type 2 diabetes mellitus without complications: Secondary | ICD-10-CM

## 2018-11-08 DIAGNOSIS — M810 Age-related osteoporosis without current pathological fracture: Principal | ICD-10-CM

## 2018-11-08 DIAGNOSIS — E559 Vitamin D deficiency, unspecified: Secondary | ICD-10-CM

## 2018-11-08 DIAGNOSIS — Z114 Encounter for screening for human immunodeficiency virus [HIV]: Secondary | ICD-10-CM

## 2018-11-08 MED ORDER — ZALEPLON 10 MG CAPSULE
ORAL_CAPSULE | Freq: Every evening | ORAL | 3 refills | 0 days
Start: 2018-11-08 — End: 2018-11-30

## 2018-11-08 MED ORDER — OMEGA-3 ACID ETHYL ESTERS 1 GRAM CAPSULE
ORAL_CAPSULE | Freq: Two times a day (BID) | ORAL | 3 refills | 0.00000 days | Status: CP
Start: 2018-11-08 — End: 2019-05-07

## 2018-11-30 MED ORDER — ZALEPLON 10 MG CAPSULE
ORAL_CAPSULE | Freq: Every evening | ORAL | 5 refills | 0.00000 days | Status: CP
Start: 2018-11-30 — End: 2018-11-30

## 2018-11-30 MED ORDER — SIROLIMUS 1 MG TABLET
ORAL_TABLET | Freq: Every day | ORAL | 11 refills | 0 days | Status: CP
Start: 2018-11-30 — End: 2019-05-04

## 2018-11-30 MED ORDER — ZALEPLON 10 MG CAPSULE: 10 mg | capsule | Freq: Every evening | 5 refills | 0 days

## 2018-12-04 DIAGNOSIS — D899 Disorder involving the immune mechanism, unspecified: Secondary | ICD-10-CM | POA: Diagnosis not present

## 2018-12-04 DIAGNOSIS — Z940 Kidney transplant status: Secondary | ICD-10-CM | POA: Diagnosis not present

## 2018-12-11 DIAGNOSIS — Z940 Kidney transplant status: Secondary | ICD-10-CM | POA: Diagnosis not present

## 2018-12-11 DIAGNOSIS — Z79899 Other long term (current) drug therapy: Secondary | ICD-10-CM | POA: Diagnosis not present

## 2018-12-11 DIAGNOSIS — F458 Other somatoform disorders: Secondary | ICD-10-CM | POA: Diagnosis not present

## 2018-12-11 DIAGNOSIS — M26609 Unspecified temporomandibular joint disorder, unspecified side: Secondary | ICD-10-CM | POA: Diagnosis not present

## 2018-12-11 DIAGNOSIS — E785 Hyperlipidemia, unspecified: Secondary | ICD-10-CM | POA: Diagnosis not present

## 2018-12-11 DIAGNOSIS — E559 Vitamin D deficiency, unspecified: Secondary | ICD-10-CM | POA: Diagnosis not present

## 2018-12-11 DIAGNOSIS — D899 Disorder involving the immune mechanism, unspecified: Secondary | ICD-10-CM | POA: Diagnosis not present

## 2018-12-11 DIAGNOSIS — E119 Type 2 diabetes mellitus without complications: Secondary | ICD-10-CM | POA: Diagnosis not present

## 2018-12-11 MED ORDER — ATORVASTATIN 10 MG TABLET
ORAL_TABLET | Freq: Every evening | ORAL | 3 refills | 0.00000 days | Status: CP
Start: 2018-12-11 — End: 2019-06-08

## 2019-01-01 DIAGNOSIS — Z940 Kidney transplant status: Principal | ICD-10-CM

## 2019-01-01 DIAGNOSIS — D899 Disorder involving the immune mechanism, unspecified: Principal | ICD-10-CM

## 2019-01-10 DIAGNOSIS — Z79899 Other long term (current) drug therapy: Secondary | ICD-10-CM | POA: Diagnosis not present

## 2019-01-10 DIAGNOSIS — D899 Disorder involving the immune mechanism, unspecified: Secondary | ICD-10-CM | POA: Diagnosis not present

## 2019-01-10 DIAGNOSIS — Z940 Kidney transplant status: Secondary | ICD-10-CM | POA: Diagnosis not present

## 2019-01-10 DIAGNOSIS — E559 Vitamin D deficiency, unspecified: Secondary | ICD-10-CM | POA: Diagnosis not present

## 2019-01-14 ENCOUNTER — Telehealth: Payer: Self-pay | Admitting: *Deleted

## 2019-01-14 NOTE — Telephone Encounter (Signed)
Call to patient to offer delayed annual exam due to Covid 19 and patient transplant status.  Patient reports prolapse issue is worse and pessary is less effective. She is up several times at night.Does not want surgery but interested in any other option. Discussed potential change in size or type of pessary but this would require office visit. Patient is also waking up soaking wet but no hot.  Interested in Dr Ammie Ferrier thoughts before rescheduling appointment.

## 2019-01-14 NOTE — Telephone Encounter (Signed)
Probably better for her to keep appt than wait months form now.  I'll see her tomorrow.  Thanks.

## 2019-01-15 ENCOUNTER — Other Ambulatory Visit (HOSPITAL_COMMUNITY)
Admission: RE | Admit: 2019-01-15 | Discharge: 2019-01-15 | Disposition: A | Discharge status: Home / Self Care | Payer: BLUE CROSS/BLUE SHIELD | Source: Ambulatory Visit | Attending: Obstetrics & Gynecology | Admitting: Obstetrics & Gynecology

## 2019-01-15 ENCOUNTER — Encounter: Payer: Self-pay | Admitting: Obstetrics & Gynecology

## 2019-01-15 ENCOUNTER — Other Ambulatory Visit: Payer: Self-pay

## 2019-01-15 ENCOUNTER — Ambulatory Visit (INDEPENDENT_AMBULATORY_CARE_PROVIDER_SITE_OTHER): Payer: BLUE CROSS/BLUE SHIELD | Admitting: Obstetrics & Gynecology

## 2019-01-15 VITALS — BP 132/86 | HR 82 | Resp 16 | Ht 66.5 in | Wt 166.8 lb

## 2019-01-15 DIAGNOSIS — D899 Disorder involving the immune mechanism, unspecified: Principal | ICD-10-CM

## 2019-01-15 DIAGNOSIS — Z940 Kidney transplant status: Principal | ICD-10-CM

## 2019-01-15 DIAGNOSIS — Z124 Encounter for screening for malignant neoplasm of cervix: Secondary | ICD-10-CM

## 2019-01-15 DIAGNOSIS — Z01419 Encounter for gynecological examination (general) (routine) without abnormal findings: Secondary | ICD-10-CM

## 2019-01-15 NOTE — Telephone Encounter (Signed)
Return call to patient. She will keep appointment. We will move her to exam room as soon as she checks in.   Encounter closed.
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
## 2019-01-15 NOTE — Patient Instructions (Signed)
At some point, you should do urodynamics.  I think we should try one of the medications for overactive bladder  Double check about your last tetanus shot.  I can update.

## 2019-01-15 NOTE — Telephone Encounter (Signed)
Patient returned call to [facility].

## 2019-01-18 DIAGNOSIS — D649 Anemia, unspecified: Principal | ICD-10-CM

## 2019-01-18 LAB — CYTOLOGY - PAP: Diagnosis: NEGATIVE

## 2019-01-29 DIAGNOSIS — Z940 Kidney transplant status: Principal | ICD-10-CM

## 2019-01-29 DIAGNOSIS — D899 Disorder involving the immune mechanism, unspecified: Principal | ICD-10-CM

## 2019-01-30 DIAGNOSIS — G4700 Insomnia, unspecified: Principal | ICD-10-CM

## 2019-01-30 MED ORDER — ZALEPLON 10 MG CAPSULE
ORAL_CAPSULE | Freq: Every evening | ORAL | 5 refills | 0 days
Start: 2019-01-30 — End: 2019-02-02

## 2019-02-02 MED ORDER — ZALEPLON 10 MG CAPSULE
ORAL_CAPSULE | Freq: Every evening | ORAL | 5 refills | 0 days
Start: 2019-02-02 — End: 2019-08-01

## 2019-02-03 MED ORDER — ZALEPLON 10 MG CAPSULE
ORAL_CAPSULE | Freq: Every evening | ORAL | 1 refills | 0.00000 days | Status: CP
Start: 2019-02-03 — End: ?

## 2019-02-07 DIAGNOSIS — D899 Disorder involving the immune mechanism, unspecified: Secondary | ICD-10-CM | POA: Diagnosis not present

## 2019-02-07 DIAGNOSIS — D649 Anemia, unspecified: Secondary | ICD-10-CM | POA: Diagnosis not present

## 2019-02-07 DIAGNOSIS — Z940 Kidney transplant status: Secondary | ICD-10-CM | POA: Diagnosis not present

## 2019-02-16 DIAGNOSIS — Z940 Kidney transplant status: Secondary | ICD-10-CM | POA: Diagnosis not present

## 2019-02-22 ENCOUNTER — Telehealth: Payer: Self-pay | Admitting: Obstetrics & Gynecology

## 2019-02-22 NOTE — Telephone Encounter (Signed)
Patient said she is waiting to hear about a medication that Dr.Miller was to research on her behalf.

## 2019-02-22 NOTE — Telephone Encounter (Signed)
Patient seen for AEX 01/15/19. Discussed medication for OAB.   Routing to Dr. Sabra Heck to review and advise.

## 2019-02-27 NOTE — Telephone Encounter (Signed)
Patient would like to follow up on message about OAB medication. She states she has contacted her transplant doctor and they state that Myrbetric is okay to take if Dr Sabra Heck thinks this is appropriate and would be helpful.  Please advise.

## 2019-03-01 ENCOUNTER — Telehealth: Payer: Self-pay | Admitting: Orthopaedic Surgery

## 2019-03-01 ENCOUNTER — Other Ambulatory Visit (INDEPENDENT_AMBULATORY_CARE_PROVIDER_SITE_OTHER): Payer: Self-pay | Admitting: Orthopedic Surgery

## 2019-03-01 MED ORDER — TRAMADOL HCL 50 MG PO TABS: 50.0000 mg | ORAL_TABLET | Freq: Two times a day (BID) | ORAL | 0 refills | Status: AC | PRN

## 2019-03-01 MED ORDER — METHOCARBAMOL 500 MG PO TABS: 500.0000 mg | ORAL_TABLET | Freq: Two times a day (BID) | ORAL | 0 refills | Status: DC | PRN

## 2019-03-01 NOTE — Telephone Encounter (Signed)
Please advise 

## 2019-03-01 NOTE — Telephone Encounter (Signed)
Sent in  per Dr Durward Fortes

## 2019-03-01 NOTE — Telephone Encounter (Signed)
I called patient 

## 2019-03-01 NOTE — Telephone Encounter (Signed)
Patient having pain again with Lspine/ Lt hip that started Monday pm. Patient request refill on Tramadol, and Methocarbamol sent to [facility] on [address]. Please call to inform patient. Patient is a Kidney transplant patient, and not comfortable coming in for an appt until she absolutely has to.

## 2019-03-19 NOTE — Telephone Encounter (Signed)
Patient requesting a call regarding a medication for overactive bladder.

## 2019-03-20 DIAGNOSIS — Z940 Kidney transplant status: Secondary | ICD-10-CM | POA: Diagnosis not present

## 2019-03-20 DIAGNOSIS — D899 Disorder involving the immune mechanism, unspecified: Secondary | ICD-10-CM | POA: Diagnosis not present

## 2019-03-20 NOTE — Telephone Encounter (Signed)
Routing to Dr. Sabra Heck to advise on Rx for OAB.

## 2019-03-21 ENCOUNTER — Encounter: Admit: 2019-03-21 | Discharge: 2019-03-22 | Payer: PRIVATE HEALTH INSURANCE

## 2019-03-21 DIAGNOSIS — Z940 Kidney transplant status: Principal | ICD-10-CM

## 2019-03-30 ENCOUNTER — Telehealth: Payer: Self-pay | Admitting: Obstetrics & Gynecology

## 2019-03-30 NOTE — Telephone Encounter (Signed)
Patient calling regarding medication for overactive bladder. States she was waiting on Dr. Sabra Heck to review the medication Myrbetric and has not received a call back. No open phone note. Patient states she cannot sleep for more than an hour at a time and it is getting worse.

## 2019-03-30 NOTE — Telephone Encounter (Signed)
Routing to Dr. Miller

## 2019-04-09 NOTE — Telephone Encounter (Signed)
Patient states she was in our office in March for AEX. She complained of urinary urgency and incontinence but due to hx of kidney transplant Dr.Miller didn't want to treat until she did some research and spoke with transplant nurse at [facility].  [facility] transplant nurse--Wincent Glean Salvo [phone].  Per patient, she had called stating Myrbetriqu was safe with kidney transplant patients.  Patient states she is getting no rest/sleep at night for going to the bathroom all night. She states she needs some relief. Will discuss with Dr.Miller and call her back. Routed to provider. Pharm. Confirmed

## 2019-04-09 NOTE — Telephone Encounter (Signed)
Patient is calling regarding medication for overactive bladder.

## 2019-04-13 MED ORDER — MIRABEGRON ER 25 MG PO TB24: 25.0000 mg | ORAL_TABLET | Freq: Every day | ORAL | 2 refills | Status: DC

## 2019-04-13 NOTE — Telephone Encounter (Signed)
rx for myrbetriq 25 mg daily to pharmacy.  There is a 50mg  dosage as well.  Most important side effect is change in BP so she does need to try and check her BP a few times a week over the next few weeks.  If this occurs, BP changes are less common with the 50mg  dosage.  #30/2RF.

## 2019-04-13 NOTE — Telephone Encounter (Signed)
Called patient and spoke with her regarding Dr.Miller's recommendations for Myrbetriq. Rx sent to pharmacy on file. She knows to keep a check on her B/P and to call if any changes.

## 2019-04-17 DIAGNOSIS — Z940 Kidney transplant status: Secondary | ICD-10-CM | POA: Diagnosis not present

## 2019-04-17 DIAGNOSIS — D899 Disorder involving the immune mechanism, unspecified: Secondary | ICD-10-CM | POA: Diagnosis not present

## 2019-04-25 ENCOUNTER — Encounter: Admit: 2019-04-25 | Discharge: 2019-04-26 | Payer: PRIVATE HEALTH INSURANCE

## 2019-04-25 ENCOUNTER — Encounter
Admit: 2019-04-25 | Discharge: 2019-04-26 | Payer: PRIVATE HEALTH INSURANCE | Attending: Nephrology | Primary: Nephrology

## 2019-04-25 DIAGNOSIS — E785 Hyperlipidemia, unspecified: Secondary | ICD-10-CM | POA: Diagnosis not present

## 2019-04-25 DIAGNOSIS — Z79899 Other long term (current) drug therapy: Secondary | ICD-10-CM | POA: Diagnosis not present

## 2019-04-25 DIAGNOSIS — Z940 Kidney transplant status: Secondary | ICD-10-CM | POA: Diagnosis not present

## 2019-04-25 DIAGNOSIS — R5382 Chronic fatigue, unspecified: Secondary | ICD-10-CM | POA: Diagnosis not present

## 2019-04-25 MED ORDER — AMLODIPINE 5 MG TABLET
ORAL_TABLET | Freq: Two times a day (BID) | ORAL | 11 refills | 0.00000 days | Status: CP
Start: 2019-04-25 — End: 2019-04-26

## 2019-04-25 MED ORDER — CARVEDILOL 6.25 MG TABLET
ORAL_TABLET | Freq: Two times a day (BID) | ORAL | 11 refills | 0 days | Status: CP
Start: 2019-04-25 — End: 2020-04-24

## 2019-04-26 MED ORDER — AMLODIPINE 10 MG TABLET
ORAL_TABLET | Freq: Every day | ORAL | 1 refills | 0 days | Status: CP
Start: 2019-04-26 — End: 2019-10-23

## 2019-05-02 DIAGNOSIS — Z940 Kidney transplant status: Secondary | ICD-10-CM | POA: Diagnosis not present

## 2019-05-02 DIAGNOSIS — D899 Disorder involving the immune mechanism, unspecified: Secondary | ICD-10-CM | POA: Diagnosis not present

## 2019-05-04 MED ORDER — SIROLIMUS 1 MG TABLET
ORAL_TABLET | Freq: Every day | ORAL | 11 refills | 0 days
Start: 2019-05-04 — End: 2019-06-03

## 2019-05-11 ENCOUNTER — Telehealth: Payer: Self-pay | Admitting: Obstetrics & Gynecology

## 2019-05-11 NOTE — Telephone Encounter (Signed)
There is a 50mg  dose of the myrbetriq that can be used as long as creatinine clearance is above 30.  I do not have any recent renal function labs that I can see and I did check the [facility] site today as well so I am unsure if it is safe to increase this or not at this time.  Will need to check with nephrologist about this.    Oxytrol is an option.  This does not have any renal dosing adjustments and I would recommend patch which is actually over the counter if this is ok with her nephrologist.  It is one patch to skin twice weekly.  Common side effects are dry eyes, dry mouth and constipation.  These are decreased with a transdermal (patch) option so that is why I would recommend this option first as long as that is ok with her nephrologist.  Above all--make sure ok any medication is ok with her nephrologist.  All other OAB medications are oral and in same class as oxytrol patches so if the patches don't work and oral is ok, would just have to try and see if any work.

## 2019-05-11 NOTE — Telephone Encounter (Signed)
Patient has been taking Myrbetriq for 4 weeks. She's not sure it's made a big difference with OAB. Advised every patient is different but may take a little more time. She's paying $280/month with coupon. Is there anything else that is less expensive she can try? Will discuss with Dr.Miller and call her back.  Routed to provider.

## 2019-05-11 NOTE — Telephone Encounter (Signed)
Patient is asking to talk with Estill Bamberg again about mirabegron prescription. She was able to get the discount for 1 month supply. She is asking if she is able to get another discount for her remaining refills?

## 2019-05-14 NOTE — Telephone Encounter (Signed)
Spoke with patient. Advised of options outlined by Dr.Miller below. She will discuss with her transplant nurse and give [place] a call back with what she decides to do.

## 2019-05-18 DIAGNOSIS — Z940 Kidney transplant status: Secondary | ICD-10-CM | POA: Diagnosis not present

## 2019-05-18 DIAGNOSIS — D899 Disorder involving the immune mechanism, unspecified: Secondary | ICD-10-CM | POA: Diagnosis not present

## 2019-06-05 DIAGNOSIS — Z940 Kidney transplant status: Secondary | ICD-10-CM | POA: Diagnosis not present

## 2019-06-05 DIAGNOSIS — D899 Disorder involving the immune mechanism, unspecified: Secondary | ICD-10-CM | POA: Diagnosis not present

## 2019-06-05 DIAGNOSIS — E785 Hyperlipidemia, unspecified: Secondary | ICD-10-CM | POA: Diagnosis not present

## 2019-06-08 MED ORDER — MIRABEGRON ER 50 MG PO TB24: 50.0000 mg | ORAL_TABLET | Freq: Every day | ORAL | 2 refills | Status: DC

## 2019-06-08 MED ORDER — ATORVASTATIN 10 MG TABLET
ORAL_TABLET | Freq: Every evening | ORAL | 3 refills | 90 days | Status: CP
Start: 2019-06-08 — End: 2019-06-12

## 2019-06-08 NOTE — Telephone Encounter (Signed)
Ok to send in rx for 50mg  mybetriq daily.  Please see if she wants a 90 day supply.  Thanks.

## 2019-06-08 NOTE — Telephone Encounter (Signed)
Patient is returning call regarding medication strength. Patient stated that her kidney doctor said that she could go to the 50mg  dose of Myrbetriq.

## 2019-06-08 NOTE — Telephone Encounter (Signed)
Routing to Dr. Sabra Heck to advise on Mybetriq 50 mg RX.

## 2019-06-08 NOTE — Telephone Encounter (Signed)
Spoke with patient, advised per Dr. Sabra Heck. Patient request 30 day supply. Rx for Mybetriq 50 mg #30/2RF to verified pharmacy. Patient verbalizes understanding.   Routing to provider for final review. Patient is agreeable to disposition. Will close encounter.

## 2019-06-12 DIAGNOSIS — Z0000 Encounter for general adult medical examination without abnormal findings: Secondary | ICD-10-CM | POA: Diagnosis not present

## 2019-06-12 MED ORDER — ATORVASTATIN 10 MG TABLET
ORAL_TABLET | Freq: Every evening | ORAL | 3 refills | 90 days | Status: CP
Start: 2019-06-12 — End: 2020-06-11

## 2019-07-09 DIAGNOSIS — Z940 Kidney transplant status: Secondary | ICD-10-CM

## 2019-07-09 DIAGNOSIS — D899 Disorder involving the immune mechanism, unspecified: Secondary | ICD-10-CM

## 2019-07-10 DIAGNOSIS — D899 Disorder involving the immune mechanism, unspecified: Secondary | ICD-10-CM | POA: Diagnosis not present

## 2019-07-10 DIAGNOSIS — Z940 Kidney transplant status: Secondary | ICD-10-CM | POA: Diagnosis not present

## 2019-07-12 ENCOUNTER — Telehealth
Admit: 2019-07-12 | Discharge: 2019-07-13 | Payer: PRIVATE HEALTH INSURANCE | Attending: Nephrology | Primary: Nephrology

## 2019-07-12 DIAGNOSIS — R5382 Chronic fatigue, unspecified: Secondary | ICD-10-CM | POA: Diagnosis not present

## 2019-07-12 DIAGNOSIS — I10 Essential (primary) hypertension: Secondary | ICD-10-CM | POA: Diagnosis not present

## 2019-07-12 DIAGNOSIS — Z940 Kidney transplant status: Secondary | ICD-10-CM | POA: Diagnosis not present

## 2019-07-12 DIAGNOSIS — Z79899 Other long term (current) drug therapy: Secondary | ICD-10-CM | POA: Diagnosis not present

## 2019-07-12 MED ORDER — EZETIMIBE 10 MG TABLET
ORAL_TABLET | Freq: Every day | ORAL | 11 refills | 30 days | Status: CP
Start: 2019-07-12 — End: 2019-07-26

## 2019-07-12 MED ORDER — ZOLPIDEM 5 MG TABLET
ORAL_TABLET | Freq: Every evening | ORAL | 0 refills | 30.00000 days | Status: CP | PRN
Start: 2019-07-12 — End: ?

## 2019-07-19 ENCOUNTER — Encounter: Payer: Self-pay | Admitting: Obstetrics & Gynecology

## 2019-07-19 DIAGNOSIS — Z940 Kidney transplant status: Secondary | ICD-10-CM

## 2019-07-19 DIAGNOSIS — Z1231 Encounter for screening mammogram for malignant neoplasm of breast: Secondary | ICD-10-CM | POA: Diagnosis not present

## 2019-07-19 MED ORDER — MYFORTIC 360 MG TABLET,DELAYED RELEASE
ORAL_TABLET | Freq: Three times a day (TID) | ORAL | 3 refills | 90.00000 days | Status: CP
Start: 2019-07-19 — End: 2020-07-18

## 2019-07-23 DIAGNOSIS — Z940 Kidney transplant status: Secondary | ICD-10-CM

## 2019-07-23 DIAGNOSIS — D899 Disorder involving the immune mechanism, unspecified: Secondary | ICD-10-CM

## 2019-07-26 DIAGNOSIS — Z940 Kidney transplant status: Secondary | ICD-10-CM

## 2019-07-26 MED ORDER — EZETIMIBE 10 MG TABLET
ORAL_TABLET | Freq: Every day | ORAL | 3 refills | 90 days | Status: CP
Start: 2019-07-26 — End: 2020-07-25

## 2019-07-27 DIAGNOSIS — Z23 Encounter for immunization: Secondary | ICD-10-CM | POA: Diagnosis not present

## 2019-08-03 DIAGNOSIS — Z0000 Encounter for general adult medical examination without abnormal findings: Secondary | ICD-10-CM

## 2019-08-03 DIAGNOSIS — R5383 Other fatigue: Secondary | ICD-10-CM

## 2019-08-06 DIAGNOSIS — Z940 Kidney transplant status: Secondary | ICD-10-CM

## 2019-08-06 DIAGNOSIS — D899 Disorder involving the immune mechanism, unspecified: Secondary | ICD-10-CM

## 2019-08-07 DIAGNOSIS — Z0000 Encounter for general adult medical examination without abnormal findings: Secondary | ICD-10-CM | POA: Diagnosis not present

## 2019-08-07 DIAGNOSIS — R5383 Other fatigue: Secondary | ICD-10-CM | POA: Diagnosis not present

## 2019-08-07 DIAGNOSIS — D849 Immunodeficiency, unspecified: Secondary | ICD-10-CM | POA: Diagnosis not present

## 2019-08-07 DIAGNOSIS — Z940 Kidney transplant status: Secondary | ICD-10-CM | POA: Diagnosis not present

## 2019-08-08 DIAGNOSIS — L720 Epidermal cyst: Secondary | ICD-10-CM | POA: Diagnosis not present

## 2019-08-08 DIAGNOSIS — L570 Actinic keratosis: Secondary | ICD-10-CM | POA: Diagnosis not present

## 2019-08-08 DIAGNOSIS — D2261 Melanocytic nevi of right upper limb, including shoulder: Secondary | ICD-10-CM | POA: Diagnosis not present

## 2019-08-08 DIAGNOSIS — D2262 Melanocytic nevi of left upper limb, including shoulder: Secondary | ICD-10-CM | POA: Diagnosis not present

## 2019-08-08 DIAGNOSIS — L821 Other seborrheic keratosis: Secondary | ICD-10-CM | POA: Diagnosis not present

## 2019-08-13 DIAGNOSIS — N3000 Acute cystitis without hematuria: Principal | ICD-10-CM

## 2019-08-13 DIAGNOSIS — Z940 Kidney transplant status: Principal | ICD-10-CM

## 2019-08-13 MED ORDER — CEPHALEXIN 500 MG CAPSULE: 500 mg | capsule | Freq: Two times a day (BID) | 0 refills | 20 days | Status: AC

## 2019-08-13 MED ORDER — HYDROCHLOROTHIAZIDE 12.5 MG TABLET: 13 mg | tablet | Freq: Every day | 11 refills | 30 days | Status: AC

## 2019-08-20 DIAGNOSIS — D899 Disorder involving the immune mechanism, unspecified: Principal | ICD-10-CM

## 2019-08-20 DIAGNOSIS — Z940 Kidney transplant status: Principal | ICD-10-CM

## 2019-09-03 DIAGNOSIS — D899 Disorder involving the immune mechanism, unspecified: Principal | ICD-10-CM

## 2019-09-03 DIAGNOSIS — Z940 Kidney transplant status: Principal | ICD-10-CM

## 2019-09-04 DIAGNOSIS — D849 Immunodeficiency, unspecified: Secondary | ICD-10-CM | POA: Diagnosis not present

## 2019-09-04 DIAGNOSIS — Z940 Kidney transplant status: Secondary | ICD-10-CM | POA: Diagnosis not present

## 2019-09-17 DIAGNOSIS — Z940 Kidney transplant status: Principal | ICD-10-CM

## 2019-09-17 DIAGNOSIS — D899 Disorder involving the immune mechanism, unspecified: Principal | ICD-10-CM

## 2019-09-25 MED ORDER — ZOLPIDEM 5 MG TABLET
ORAL_TABLET | Freq: Every evening | ORAL | 3 refills | 30 days | Status: CP | PRN
Start: 2019-09-25 — End: ?

## 2019-10-01 ENCOUNTER — Other Ambulatory Visit: Payer: Self-pay | Admitting: Obstetrics & Gynecology

## 2019-10-02 NOTE — Telephone Encounter (Signed)
Medication refill request: Myrbetriq  Last AEX:  01-15-2019 SM  Next AEX: 01-21-20 Last MMG (if hormonal medication request): n/a Refill authorized: Today, please advise.   Medication pended for #30, 2RF. Please refill if appropriate.

## 2019-10-03 DIAGNOSIS — G4700 Insomnia, unspecified: Principal | ICD-10-CM

## 2019-10-03 MED ORDER — ZALEPLON 10 MG CAPSULE
ORAL_CAPSULE | Freq: Every evening | ORAL | 5 refills | 0 days
Start: 2019-10-03 — End: 2020-03-31

## 2019-10-09 DIAGNOSIS — D849 Immunodeficiency, unspecified: Secondary | ICD-10-CM | POA: Diagnosis not present

## 2019-10-09 DIAGNOSIS — Z940 Kidney transplant status: Secondary | ICD-10-CM | POA: Diagnosis not present

## 2019-10-11 DIAGNOSIS — Z940 Kidney transplant status: Principal | ICD-10-CM

## 2019-10-11 DIAGNOSIS — G4700 Insomnia, unspecified: Principal | ICD-10-CM

## 2019-10-11 MED ORDER — HYDROCHLOROTHIAZIDE 12.5 MG TABLET
ORAL_TABLET | Freq: Every day | ORAL | 11 refills | 30.00000 days | Status: CP
Start: 2019-10-11 — End: 2020-10-10

## 2019-10-11 MED ORDER — ZALEPLON 10 MG CAPSULE
ORAL_CAPSULE | Freq: Every evening | ORAL | 5 refills | 30.00000 days | Status: CP
Start: 2019-10-11 — End: 2020-04-08

## 2019-10-15 DIAGNOSIS — Z940 Kidney transplant status: Principal | ICD-10-CM

## 2019-10-15 DIAGNOSIS — D899 Disorder involving the immune mechanism, unspecified: Principal | ICD-10-CM

## 2019-10-19 DIAGNOSIS — G4700 Insomnia, unspecified: Principal | ICD-10-CM

## 2019-10-19 DIAGNOSIS — Z940 Kidney transplant status: Principal | ICD-10-CM

## 2019-10-19 MED ORDER — ZALEPLON 10 MG CAPSULE
ORAL_CAPSULE | Freq: Every evening | ORAL | 5 refills | 30 days | Status: CP
Start: 2019-10-19 — End: 2020-04-16

## 2019-10-29 DIAGNOSIS — Z940 Kidney transplant status: Principal | ICD-10-CM

## 2019-10-29 DIAGNOSIS — D899 Disorder involving the immune mechanism, unspecified: Principal | ICD-10-CM

## 2019-11-07 DIAGNOSIS — E785 Hyperlipidemia, unspecified: Principal | ICD-10-CM

## 2019-11-12 DIAGNOSIS — D899 Disorder involving the immune mechanism, unspecified: Principal | ICD-10-CM

## 2019-11-12 DIAGNOSIS — Z940 Kidney transplant status: Principal | ICD-10-CM

## 2019-11-14 DIAGNOSIS — E785 Hyperlipidemia, unspecified: Secondary | ICD-10-CM | POA: Diagnosis not present

## 2019-11-14 DIAGNOSIS — D849 Immunodeficiency, unspecified: Secondary | ICD-10-CM | POA: Diagnosis not present

## 2019-11-14 DIAGNOSIS — Z940 Kidney transplant status: Secondary | ICD-10-CM | POA: Diagnosis not present

## 2019-11-16 DIAGNOSIS — Z940 Kidney transplant status: Principal | ICD-10-CM

## 2019-11-16 DIAGNOSIS — E7800 Pure hypercholesterolemia, unspecified: Principal | ICD-10-CM

## 2019-11-16 MED ORDER — ATORVASTATIN 10 MG TABLET
ORAL_TABLET | Freq: Every day | ORAL | 3 refills | 90 days | Status: CP
Start: 2019-11-16 — End: 2020-11-15

## 2019-11-26 DIAGNOSIS — Z940 Kidney transplant status: Principal | ICD-10-CM

## 2019-11-26 DIAGNOSIS — D899 Disorder involving the immune mechanism, unspecified: Principal | ICD-10-CM

## 2019-12-10 DIAGNOSIS — D899 Disorder involving the immune mechanism, unspecified: Principal | ICD-10-CM

## 2019-12-10 DIAGNOSIS — Z940 Kidney transplant status: Principal | ICD-10-CM

## 2019-12-18 DIAGNOSIS — E559 Vitamin D deficiency, unspecified: Principal | ICD-10-CM

## 2019-12-18 DIAGNOSIS — N3000 Acute cystitis without hematuria: Principal | ICD-10-CM

## 2019-12-18 DIAGNOSIS — Z940 Kidney transplant status: Principal | ICD-10-CM

## 2019-12-18 DIAGNOSIS — E119 Type 2 diabetes mellitus without complications: Principal | ICD-10-CM

## 2019-12-24 DIAGNOSIS — D899 Disorder involving the immune mechanism, unspecified: Principal | ICD-10-CM

## 2019-12-24 DIAGNOSIS — Z940 Kidney transplant status: Principal | ICD-10-CM

## 2019-12-26 ENCOUNTER — Encounter: Admit: 2019-12-26 | Discharge: 2019-12-27 | Payer: PRIVATE HEALTH INSURANCE

## 2019-12-26 DIAGNOSIS — Z940 Kidney transplant status: Secondary | ICD-10-CM | POA: Diagnosis not present

## 2019-12-26 DIAGNOSIS — N3000 Acute cystitis without hematuria: Secondary | ICD-10-CM | POA: Diagnosis not present

## 2019-12-26 DIAGNOSIS — E559 Vitamin D deficiency, unspecified: Secondary | ICD-10-CM | POA: Diagnosis not present

## 2019-12-26 DIAGNOSIS — E119 Type 2 diabetes mellitus without complications: Secondary | ICD-10-CM | POA: Diagnosis not present

## 2019-12-26 DIAGNOSIS — N271 Small kidney, bilateral: Secondary | ICD-10-CM | POA: Diagnosis not present

## 2019-12-28 DIAGNOSIS — Z940 Kidney transplant status: Principal | ICD-10-CM

## 2019-12-29 ENCOUNTER — Ambulatory Visit: Payer: Medicare Other | Attending: Internal Medicine

## 2019-12-29 DIAGNOSIS — Z23 Encounter for immunization: Secondary | ICD-10-CM

## 2019-12-29 NOTE — Progress Notes (Signed)
   Covid-19 Vaccination Clinic  Name:  Linda Gilmore    MRN: [id number] DOB: Apr 01, 1956  12/29/2019  [name] was observed post Covid-19 immunization for 15 minutes without incidence. She was provided with Vaccine Information Sheet and instruction to access the V-Safe system.   [name] was instructed to call 911 with any severe reactions post vaccine: Marland Kitchen Difficulty breathing  . Swelling of your face and throat  . A fast heartbeat  . A bad rash all over your body  . Dizziness and weakness    Immunizations Administered    Name Date Dose VIS Date Route   Pfizer COVID-19 Vaccine 12/29/2019  7:03 PM 0.3 mL 10/12/2019 Intramuscular   Manufacturer: [place]   Lot: [id number]   [place]: [id number]

## 2019-12-31 ENCOUNTER — Other Ambulatory Visit: Payer: Self-pay | Admitting: Obstetrics & Gynecology

## 2019-12-31 ENCOUNTER — Encounter
Admit: 2019-12-31 | Discharge: 2020-01-01 | Payer: PRIVATE HEALTH INSURANCE | Attending: Nephrology | Primary: Nephrology

## 2019-12-31 DIAGNOSIS — Z940 Kidney transplant status: Principal | ICD-10-CM

## 2019-12-31 DIAGNOSIS — D899 Disorder involving the immune mechanism, unspecified: Principal | ICD-10-CM

## 2019-12-31 DIAGNOSIS — D649 Anemia, unspecified: Secondary | ICD-10-CM | POA: Diagnosis not present

## 2019-12-31 DIAGNOSIS — I10 Essential (primary) hypertension: Secondary | ICD-10-CM | POA: Diagnosis not present

## 2019-12-31 DIAGNOSIS — E871 Hypo-osmolality and hyponatremia: Secondary | ICD-10-CM | POA: Diagnosis not present

## 2020-01-01 NOTE — Telephone Encounter (Signed)
Medication refill request: myrbetriq Last AEX:  01-15-2019 SM  Next AEX: 01-21-20 Last MMG (if hormonal medication request): n/a Refill authorized: Today, please advise.   Medication pended for #30, 0RF. Please refill if appropriate.

## 2020-01-03 MED ORDER — OMEGA-3 ACID ETHYL ESTERS 1 GRAM CAPSULE
ORAL_CAPSULE | Freq: Every day | ORAL | 3 refills | 90 days | Status: CP
Start: 2020-01-03 — End: 2021-01-02

## 2020-01-07 DIAGNOSIS — D899 Disorder involving the immune mechanism, unspecified: Principal | ICD-10-CM

## 2020-01-07 DIAGNOSIS — Z940 Kidney transplant status: Principal | ICD-10-CM

## 2020-01-21 ENCOUNTER — Other Ambulatory Visit: Payer: Self-pay

## 2020-01-21 ENCOUNTER — Telehealth: Payer: Self-pay

## 2020-01-21 ENCOUNTER — Ambulatory Visit (INDEPENDENT_AMBULATORY_CARE_PROVIDER_SITE_OTHER): Payer: BC Managed Care – PPO | Admitting: Obstetrics & Gynecology

## 2020-01-21 ENCOUNTER — Encounter: Payer: Self-pay | Admitting: Obstetrics & Gynecology

## 2020-01-21 ENCOUNTER — Other Ambulatory Visit (HOSPITAL_COMMUNITY)
Admission: RE | Admit: 2020-01-21 | Discharge: 2020-01-21 | Disposition: A | Discharge status: Home / Self Care | Payer: BC Managed Care – PPO | Source: Ambulatory Visit | Attending: Obstetrics & Gynecology | Admitting: Obstetrics & Gynecology

## 2020-01-21 VITALS — BP 130/78 | HR 76 | Temp 97.8°F | Resp 10 | Ht 66.25 in | Wt 167.6 lb

## 2020-01-21 DIAGNOSIS — D899 Disorder involving the immune mechanism, unspecified: Principal | ICD-10-CM

## 2020-01-21 DIAGNOSIS — Z940 Kidney transplant status: Principal | ICD-10-CM

## 2020-01-21 DIAGNOSIS — Z01419 Encounter for gynecological examination (general) (routine) without abnormal findings: Secondary | ICD-10-CM | POA: Diagnosis not present

## 2020-01-21 DIAGNOSIS — Z124 Encounter for screening for malignant neoplasm of cervix: Secondary | ICD-10-CM | POA: Diagnosis not present

## 2020-01-21 HISTORY — PX: DENTAL SURGERY: SHX609

## 2020-01-21 MED ORDER — PREMARIN 0.625 MG/GM VA CREA: TOPICAL_CREAM | VAGINAL | 1 refills | Status: DC

## 2020-01-21 MED ORDER — MIRABEGRON ER 50 MG PO TB24: 50.0000 mg | ORAL_TABLET | Freq: Every day | ORAL | 12 refills | Status: DC

## 2020-01-21 NOTE — Patient Instructions (Signed)
Please check about last tetanus vaccination to see if you've had one updated since 2010.  You will check with nephrologist about BMD this year.

## 2020-01-21 NOTE — Telephone Encounter (Signed)
Patient called requesting a different medication due to coverage of benefits.
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
## 2020-01-21 NOTE — Telephone Encounter (Signed)
Left message to call Cebastian Neis, RN at [facility] [phone].   

## 2020-01-22 ENCOUNTER — Encounter: Admit: 2020-01-22 | Discharge: 2020-01-23 | Payer: PRIVATE HEALTH INSURANCE

## 2020-01-22 ENCOUNTER — Ambulatory Visit: Payer: BC Managed Care – PPO | Attending: Internal Medicine

## 2020-01-22 DIAGNOSIS — Z23 Encounter for immunization: Secondary | ICD-10-CM

## 2020-01-22 DIAGNOSIS — Z940 Kidney transplant status: Secondary | ICD-10-CM | POA: Diagnosis not present

## 2020-01-22 LAB — CYTOLOGY - PAP
Comment: NEGATIVE
Diagnosis: NEGATIVE
High risk HPV: NEGATIVE

## 2020-01-22 NOTE — Telephone Encounter (Signed)
Spoke with patient. Request alternative to Premarin vaginal cream, not covered by plan, request Estrace vaginal cream. Confirmed pharmacy on file. Advised I will review with Dr. Sabra Heck and f/u, patient agreeable.   Dr. Sabra Heck -please advise on Estrace vaginal cream Rx.

## 2020-01-22 NOTE — Progress Notes (Signed)
   Covid-19 Vaccination Clinic  Name:  Linda Gilmore    MRN: [id number] DOB: Mar 08, 1956  01/22/2020  Linda Gilmore was observed post Covid-19 immunization for 15 minutes without incident. She was provided with Vaccine Information Sheet and instruction to access the V-Safe system.   Linda Gilmore was instructed to call 911 with any severe reactions post vaccine: Marland Kitchen Difficulty breathing  . Swelling of face and throat  . A fast heartbeat  . A bad rash all over body  . Dizziness and weakness   Immunizations Administered    Name Date Dose VIS Date Route   Pfizer COVID-19 Vaccine 01/22/2020  1:06 PM 0.3 mL 10/12/2019 Intramuscular   Manufacturer: [place]   Lot: [id number]   [place]: [id number]

## 2020-01-23 ENCOUNTER — Other Ambulatory Visit: Payer: Self-pay | Admitting: Obstetrics & Gynecology

## 2020-01-23 MED ORDER — ESTRADIOL 0.1 MG/GM VA CREA: TOPICAL_CREAM | VAGINAL | 4 refills | Status: DC

## 2020-01-23 NOTE — Telephone Encounter (Signed)
Patient notified of RX.   Encounter closed.

## 2020-01-23 NOTE — Telephone Encounter (Signed)
Rx for estrace vaginal cream 1 gram pv twice weekly sent to [facility] on file.  Thanks.

## 2020-01-23 NOTE — Progress Notes (Signed)
Estrace vaginal cream sent to pharmacy 

## 2020-01-28 DIAGNOSIS — Z940 Kidney transplant status: Principal | ICD-10-CM

## 2020-01-28 DIAGNOSIS — E785 Hyperlipidemia, unspecified: Principal | ICD-10-CM

## 2020-01-28 DIAGNOSIS — D899 Disorder involving the immune mechanism, unspecified: Principal | ICD-10-CM

## 2020-01-29 ENCOUNTER — Ambulatory Visit: Payer: BC Managed Care – PPO

## 2020-01-31 DIAGNOSIS — Z940 Kidney transplant status: Principal | ICD-10-CM

## 2020-01-31 MED ORDER — SIROLIMUS 1 MG TABLET
ORAL_TABLET | Freq: Every day | ORAL | 3 refills | 90.00000 days | Status: CP
Start: 2020-01-31 — End: 2021-01-30

## 2020-02-04 DIAGNOSIS — Z940 Kidney transplant status: Principal | ICD-10-CM

## 2020-02-04 DIAGNOSIS — D899 Disorder involving the immune mechanism, unspecified: Principal | ICD-10-CM

## 2020-02-13 ENCOUNTER — Other Ambulatory Visit: Payer: Self-pay

## 2020-02-14 ENCOUNTER — Ambulatory Visit (INDEPENDENT_AMBULATORY_CARE_PROVIDER_SITE_OTHER): Payer: BC Managed Care – PPO | Admitting: Obstetrics & Gynecology

## 2020-02-14 ENCOUNTER — Encounter: Payer: Self-pay | Admitting: Obstetrics & Gynecology

## 2020-02-14 ENCOUNTER — Other Ambulatory Visit: Payer: Self-pay | Admitting: *Deleted

## 2020-02-14 ENCOUNTER — Ambulatory Visit (INDEPENDENT_AMBULATORY_CARE_PROVIDER_SITE_OTHER): Payer: BC Managed Care – PPO

## 2020-02-14 ENCOUNTER — Other Ambulatory Visit: Payer: Self-pay

## 2020-02-14 VITALS — BP 124/80 | HR 68 | Temp 97.3°F | Resp 16 | Wt 169.0 lb

## 2020-02-14 DIAGNOSIS — N952 Postmenopausal atrophic vaginitis: Secondary | ICD-10-CM | POA: Diagnosis not present

## 2020-02-14 DIAGNOSIS — Z940 Kidney transplant status: Secondary | ICD-10-CM | POA: Diagnosis not present

## 2020-02-14 DIAGNOSIS — N939 Abnormal uterine and vaginal bleeding, unspecified: Secondary | ICD-10-CM

## 2020-02-14 DIAGNOSIS — Q612 Polycystic kidney, adult type: Secondary | ICD-10-CM | POA: Diagnosis not present

## 2020-02-17 DIAGNOSIS — Z940 Kidney transplant status: Secondary | ICD-10-CM | POA: Insufficient documentation

## 2020-02-17 NOTE — Progress Notes (Signed)
64 y.o. G1P1 Divorced White or Caucasian female here for pelvic ultrasound due to possible shortened vaginal from pessary use and vaginal irritation.  She needed a smaller pessary as well and it was difficult to identify her cervix with the last physical exam.  I felt ultrasound evaluation might help me with determining whether she has scarring of the vagina or just a very atrophic vagina/cervix.  Denies vaginal bleeding.  Patient's last menstrual period was 06/01/2012.  Contraception: PMP  Findings: UTERUS: 7.8 x 6.9 x 4.5cm with 3.8 x 3.7cm anterior fibroid EMS: 3.61mm ADNEXA: Left ovary: could not be visualized with transvaginal or transabdominal approach       Right ovary: could not be visualized with transabdominal or transvaginal approach CUL DE SAC: no free fluid  Pelvic kidney noted.  Multiple cysts from native kidney are noted lateral and above pelvic transplanted kidney.    Also, with transvaginal probe, it appears that probe gets right to the cervix without any tissue between probe and cervix.  Discussion:  Ultrasonographer supervised.  Initially, ultrasound showed two cysts lateral and just above her pelvic kidney.  I reviewed this and pt was rescanned with me present to evaluate her native kidneys.  They are enlarged due to her PCKD and both kidneys extend through the abdomen.  The cysts are part of her native kidney on the right.    Assessment:  Possible vaginal scarring from pessary that is not confirmed today with ultrasound findings Enlarged native kidneys due to PCKD Vaginal atrophy  Plan:  Pt will return for routine follow up and knows to call with any concerns/questions. She is going to continue using estrace vaginal cream with pessary.

## 2020-02-18 DIAGNOSIS — Z940 Kidney transplant status: Principal | ICD-10-CM

## 2020-02-18 DIAGNOSIS — D849 Immunodeficiency, unspecified: Principal | ICD-10-CM

## 2020-02-19 DIAGNOSIS — Z79899 Other long term (current) drug therapy: Principal | ICD-10-CM

## 2020-02-19 DIAGNOSIS — Z940 Kidney transplant status: Principal | ICD-10-CM

## 2020-02-25 ENCOUNTER — Encounter: Admit: 2020-02-25 | Discharge: 2020-02-26 | Payer: PRIVATE HEALTH INSURANCE

## 2020-02-25 DIAGNOSIS — Z940 Kidney transplant status: Principal | ICD-10-CM

## 2020-02-25 DIAGNOSIS — D849 Immunodeficiency, unspecified: Principal | ICD-10-CM

## 2020-02-25 DIAGNOSIS — Z79899 Other long term (current) drug therapy: Secondary | ICD-10-CM | POA: Diagnosis not present

## 2020-02-29 DIAGNOSIS — G4700 Insomnia, unspecified: Principal | ICD-10-CM

## 2020-02-29 MED ORDER — ZALEPLON 5 MG CAPSULE
ORAL_CAPSULE | Freq: Every evening | ORAL | 3 refills | 30.00000 days | Status: CP | PRN
Start: 2020-02-29 — End: ?

## 2020-03-03 DIAGNOSIS — D849 Immunodeficiency, unspecified: Principal | ICD-10-CM

## 2020-03-03 DIAGNOSIS — Z940 Kidney transplant status: Principal | ICD-10-CM

## 2020-03-17 DIAGNOSIS — D849 Immunodeficiency, unspecified: Principal | ICD-10-CM

## 2020-03-17 DIAGNOSIS — Z940 Kidney transplant status: Principal | ICD-10-CM

## 2020-03-24 DIAGNOSIS — Z940 Kidney transplant status: Principal | ICD-10-CM

## 2020-03-24 DIAGNOSIS — D849 Immunodeficiency, unspecified: Principal | ICD-10-CM

## 2020-03-26 DIAGNOSIS — D849 Immunodeficiency, unspecified: Secondary | ICD-10-CM | POA: Diagnosis not present

## 2020-03-26 DIAGNOSIS — Z940 Kidney transplant status: Secondary | ICD-10-CM | POA: Diagnosis not present

## 2020-03-31 DIAGNOSIS — Z940 Kidney transplant status: Principal | ICD-10-CM

## 2020-03-31 DIAGNOSIS — D849 Immunodeficiency, unspecified: Principal | ICD-10-CM

## 2020-04-03 DIAGNOSIS — G4700 Insomnia, unspecified: Secondary | ICD-10-CM | POA: Diagnosis not present

## 2020-04-03 DIAGNOSIS — I10 Essential (primary) hypertension: Secondary | ICD-10-CM | POA: Diagnosis not present

## 2020-04-03 DIAGNOSIS — Z940 Kidney transplant status: Secondary | ICD-10-CM | POA: Diagnosis not present

## 2020-04-14 DIAGNOSIS — Z940 Kidney transplant status: Principal | ICD-10-CM

## 2020-04-14 DIAGNOSIS — D849 Immunodeficiency, unspecified: Principal | ICD-10-CM

## 2020-04-21 DIAGNOSIS — E785 Hyperlipidemia, unspecified: Principal | ICD-10-CM

## 2020-04-21 DIAGNOSIS — Z940 Kidney transplant status: Principal | ICD-10-CM

## 2020-04-21 DIAGNOSIS — D849 Immunodeficiency, unspecified: Principal | ICD-10-CM

## 2020-04-22 DIAGNOSIS — Z940 Kidney transplant status: Secondary | ICD-10-CM | POA: Diagnosis not present

## 2020-04-22 DIAGNOSIS — D849 Immunodeficiency, unspecified: Secondary | ICD-10-CM | POA: Diagnosis not present

## 2020-04-28 DIAGNOSIS — Z940 Kidney transplant status: Principal | ICD-10-CM

## 2020-04-28 DIAGNOSIS — D849 Immunodeficiency, unspecified: Principal | ICD-10-CM

## 2020-05-01 MED ORDER — AMLODIPINE 5 MG TABLET
ORAL_TABLET | Freq: Two times a day (BID) | ORAL | 11 refills | 30.00000 days | Status: CP
Start: 2020-05-01 — End: 2020-10-28

## 2020-05-12 DIAGNOSIS — D849 Immunodeficiency, unspecified: Principal | ICD-10-CM

## 2020-05-12 DIAGNOSIS — Z940 Kidney transplant status: Principal | ICD-10-CM

## 2020-05-13 DIAGNOSIS — Z940 Kidney transplant status: Principal | ICD-10-CM

## 2020-05-13 MED ORDER — SIROLIMUS 1 MG TABLET
ORAL_TABLET | Freq: Every day | ORAL | 11 refills | 30.00000 days | Status: CP
Start: 2020-05-13 — End: 2021-05-13

## 2020-05-13 MED ORDER — MYCOPHENOLATE SODIUM 360 MG TABLET,DELAYED RELEASE
ORAL_TABLET | Freq: Three times a day (TID) | ORAL | 11 refills | 30.00000 days | Status: CP
Start: 2020-05-13 — End: 2021-05-13

## 2020-05-19 DIAGNOSIS — D849 Immunodeficiency, unspecified: Principal | ICD-10-CM

## 2020-05-19 DIAGNOSIS — Z940 Kidney transplant status: Principal | ICD-10-CM

## 2020-05-26 DIAGNOSIS — Z940 Kidney transplant status: Principal | ICD-10-CM

## 2020-05-26 DIAGNOSIS — D849 Immunodeficiency, unspecified: Principal | ICD-10-CM

## 2020-06-03 DIAGNOSIS — D849 Immunodeficiency, unspecified: Secondary | ICD-10-CM | POA: Diagnosis not present

## 2020-06-03 DIAGNOSIS — Z940 Kidney transplant status: Secondary | ICD-10-CM | POA: Diagnosis not present

## 2020-06-07 MED ORDER — CARVEDILOL 6.25 MG TABLET
ORAL_TABLET | Freq: Two times a day (BID) | ORAL | 3 refills | 0 days
Start: 2020-06-07 — End: ?

## 2020-06-09 DIAGNOSIS — Z940 Kidney transplant status: Principal | ICD-10-CM

## 2020-06-09 DIAGNOSIS — D849 Immunodeficiency, unspecified: Principal | ICD-10-CM

## 2020-06-11 MED ORDER — CARVEDILOL 6.25 MG TABLET
ORAL_TABLET | Freq: Two times a day (BID) | ORAL | 3 refills | 90.00000 days | Status: CP
Start: 2020-06-11 — End: 2021-06-11

## 2020-06-16 DIAGNOSIS — D849 Immunodeficiency, unspecified: Principal | ICD-10-CM

## 2020-06-16 DIAGNOSIS — Z940 Kidney transplant status: Principal | ICD-10-CM

## 2020-06-18 DIAGNOSIS — Z940 Kidney transplant status: Secondary | ICD-10-CM | POA: Diagnosis not present

## 2020-06-18 DIAGNOSIS — D849 Immunodeficiency, unspecified: Secondary | ICD-10-CM | POA: Diagnosis not present

## 2020-06-26 DIAGNOSIS — Z940 Kidney transplant status: Principal | ICD-10-CM

## 2020-07-08 MED ORDER — EZETIMIBE 10 MG TABLET
ORAL_TABLET | 3 refills | 0 days | Status: CP
Start: 2020-07-08 — End: ?

## 2020-07-09 MED ORDER — EZETIMIBE 10 MG TABLET
ORAL_TABLET | Freq: Every day | ORAL | 3 refills | 90.00000 days | Status: CP
Start: 2020-07-09 — End: 2021-07-09

## 2020-07-09 MED ORDER — AMLODIPINE 5 MG TABLET
ORAL_TABLET | Freq: Two times a day (BID) | ORAL | 11 refills | 30.00000 days | Status: CP
Start: 2020-07-09 — End: 2021-01-05

## 2020-07-14 DIAGNOSIS — D849 Immunodeficiency, unspecified: Principal | ICD-10-CM

## 2020-07-14 DIAGNOSIS — E785 Hyperlipidemia, unspecified: Principal | ICD-10-CM

## 2020-07-14 DIAGNOSIS — Z940 Kidney transplant status: Principal | ICD-10-CM

## 2020-07-22 ENCOUNTER — Encounter: Admit: 2020-07-22 | Discharge: 2020-07-23 | Payer: PRIVATE HEALTH INSURANCE

## 2020-07-22 DIAGNOSIS — Z940 Kidney transplant status: Principal | ICD-10-CM

## 2020-07-22 DIAGNOSIS — E785 Hyperlipidemia, unspecified: Principal | ICD-10-CM

## 2020-07-22 DIAGNOSIS — Z79899 Other long term (current) drug therapy: Secondary | ICD-10-CM | POA: Diagnosis not present

## 2020-07-22 MED ORDER — AMLODIPINE 5 MG TABLET
ORAL_TABLET | Freq: Two times a day (BID) | ORAL | 1 refills | 90 days | Status: CP
Start: 2020-07-22 — End: 2021-01-18

## 2020-07-22 MED ORDER — MYFORTIC 360 MG TABLET,DELAYED RELEASE
ORAL_TABLET | 3 refills | 0 days | Status: CP
Start: 2020-07-22 — End: ?

## 2020-07-22 MED ORDER — POTASSIUM CHLORIDE ER 20 MEQ TABLET,EXTENDED RELEASE(PART/CRYST)
ORAL_TABLET | Freq: Two times a day (BID) | ORAL | 0 refills | 7 days | Status: CP
Start: 2020-07-22 — End: 2020-07-29

## 2020-07-24 MED ORDER — AMLODIPINE 5 MG TABLET
ORAL_TABLET | Freq: Every day | ORAL | 1 refills | 90.00000 days | Status: CP
Start: 2020-07-24 — End: 2021-01-20

## 2020-07-31 DIAGNOSIS — Z940 Kidney transplant status: Secondary | ICD-10-CM | POA: Diagnosis not present

## 2020-07-31 DIAGNOSIS — I10 Essential (primary) hypertension: Secondary | ICD-10-CM | POA: Diagnosis not present

## 2020-07-31 DIAGNOSIS — E876 Hypokalemia: Secondary | ICD-10-CM | POA: Diagnosis not present

## 2020-07-31 DIAGNOSIS — G4700 Insomnia, unspecified: Secondary | ICD-10-CM | POA: Diagnosis not present

## 2020-08-04 DIAGNOSIS — Z1231 Encounter for screening mammogram for malignant neoplasm of breast: Secondary | ICD-10-CM | POA: Diagnosis not present

## 2020-08-07 DIAGNOSIS — D229 Melanocytic nevi, unspecified: Secondary | ICD-10-CM | POA: Diagnosis not present

## 2020-08-07 DIAGNOSIS — L821 Other seborrheic keratosis: Secondary | ICD-10-CM | POA: Diagnosis not present

## 2020-08-07 DIAGNOSIS — L570 Actinic keratosis: Secondary | ICD-10-CM | POA: Diagnosis not present

## 2020-08-07 DIAGNOSIS — D1801 Hemangioma of skin and subcutaneous tissue: Secondary | ICD-10-CM | POA: Diagnosis not present

## 2020-08-11 DIAGNOSIS — D849 Immunodeficiency, unspecified: Principal | ICD-10-CM

## 2020-08-11 DIAGNOSIS — Z940 Kidney transplant status: Principal | ICD-10-CM

## 2020-08-12 ENCOUNTER — Encounter: Payer: Self-pay | Admitting: Obstetrics & Gynecology

## 2020-08-20 DIAGNOSIS — Z940 Kidney transplant status: Principal | ICD-10-CM

## 2020-08-20 DIAGNOSIS — D849 Immunodeficiency, unspecified: Principal | ICD-10-CM

## 2020-08-21 DIAGNOSIS — D849 Immunodeficiency, unspecified: Secondary | ICD-10-CM | POA: Diagnosis not present

## 2020-08-21 DIAGNOSIS — Z940 Kidney transplant status: Secondary | ICD-10-CM | POA: Diagnosis not present

## 2020-09-01 DIAGNOSIS — Z940 Kidney transplant status: Principal | ICD-10-CM

## 2020-09-01 DIAGNOSIS — M810 Age-related osteoporosis without current pathological fracture: Principal | ICD-10-CM

## 2020-09-03 DIAGNOSIS — D849 Immunodeficiency, unspecified: Principal | ICD-10-CM

## 2020-09-03 DIAGNOSIS — Z940 Kidney transplant status: Principal | ICD-10-CM

## 2020-09-08 DIAGNOSIS — D849 Immunodeficiency, unspecified: Principal | ICD-10-CM

## 2020-09-08 DIAGNOSIS — Z940 Kidney transplant status: Principal | ICD-10-CM

## 2020-09-15 DIAGNOSIS — D849 Immunodeficiency, unspecified: Principal | ICD-10-CM

## 2020-09-15 DIAGNOSIS — Z940 Kidney transplant status: Principal | ICD-10-CM

## 2020-09-22 DIAGNOSIS — Z940 Kidney transplant status: Principal | ICD-10-CM

## 2020-09-22 MED ORDER — HYDROCHLOROTHIAZIDE 12.5 MG TABLET
ORAL_TABLET | Freq: Every day | ORAL | 3 refills | 90.00000 days | Status: CP
Start: 2020-09-22 — End: 2020-12-03

## 2020-09-24 DIAGNOSIS — Z940 Kidney transplant status: Principal | ICD-10-CM

## 2020-09-24 MED ORDER — HYDROCHLOROTHIAZIDE 25 MG TABLET
ORAL_TABLET | 3 refills | 0 days
Start: 2020-09-24 — End: ?

## 2020-09-29 DIAGNOSIS — Z940 Kidney transplant status: Principal | ICD-10-CM

## 2020-09-29 DIAGNOSIS — D849 Immunodeficiency, unspecified: Principal | ICD-10-CM

## 2020-09-30 DIAGNOSIS — D849 Immunodeficiency, unspecified: Secondary | ICD-10-CM | POA: Diagnosis not present

## 2020-09-30 DIAGNOSIS — Z940 Kidney transplant status: Secondary | ICD-10-CM | POA: Diagnosis not present

## 2020-10-06 DIAGNOSIS — D849 Immunodeficiency, unspecified: Principal | ICD-10-CM

## 2020-10-06 DIAGNOSIS — Z940 Kidney transplant status: Principal | ICD-10-CM

## 2020-10-13 DIAGNOSIS — Z940 Kidney transplant status: Principal | ICD-10-CM

## 2020-10-13 DIAGNOSIS — D849 Immunodeficiency, unspecified: Principal | ICD-10-CM

## 2020-10-27 DIAGNOSIS — Z940 Kidney transplant status: Principal | ICD-10-CM

## 2020-10-27 DIAGNOSIS — D849 Immunodeficiency, unspecified: Principal | ICD-10-CM

## 2020-10-28 DIAGNOSIS — D849 Immunodeficiency, unspecified: Secondary | ICD-10-CM | POA: Diagnosis not present

## 2020-10-28 DIAGNOSIS — Z940 Kidney transplant status: Secondary | ICD-10-CM | POA: Diagnosis not present

## 2020-11-03 DIAGNOSIS — Z940 Kidney transplant status: Principal | ICD-10-CM

## 2020-11-03 DIAGNOSIS — D849 Immunodeficiency, unspecified: Principal | ICD-10-CM

## 2020-11-04 DIAGNOSIS — Z940 Kidney transplant status: Principal | ICD-10-CM

## 2020-11-10 DIAGNOSIS — Z940 Kidney transplant status: Principal | ICD-10-CM

## 2020-11-10 DIAGNOSIS — D849 Immunodeficiency, unspecified: Principal | ICD-10-CM

## 2020-11-21 DIAGNOSIS — E7800 Pure hypercholesterolemia, unspecified: Principal | ICD-10-CM

## 2020-11-21 MED ORDER — ATORVASTATIN 10 MG TABLET
ORAL_TABLET | Freq: Every day | ORAL | 3 refills | 90 days | Status: CP
Start: 2020-11-21 — End: 2021-11-21

## 2020-11-21 MED ORDER — GABAPENTIN 100 MG CAPSULE
ORAL_CAPSULE | Freq: Every evening | ORAL | 3 refills | 90 days | Status: CP
Start: 2020-11-21 — End: 2021-11-21

## 2020-11-24 DIAGNOSIS — D849 Immunodeficiency, unspecified: Principal | ICD-10-CM

## 2020-11-24 DIAGNOSIS — Z940 Kidney transplant status: Principal | ICD-10-CM

## 2020-12-03 DIAGNOSIS — Z940 Kidney transplant status: Principal | ICD-10-CM

## 2020-12-03 MED ORDER — HYDROCHLOROTHIAZIDE 12.5 MG TABLET
ORAL_TABLET | Freq: Every day | ORAL | 3 refills | 90.00000 days | Status: CP
Start: 2020-12-03 — End: 2020-12-05

## 2020-12-05 DIAGNOSIS — Z940 Kidney transplant status: Principal | ICD-10-CM

## 2020-12-05 DIAGNOSIS — D849 Immunodeficiency, unspecified: Principal | ICD-10-CM

## 2020-12-05 MED ORDER — HYDROCHLOROTHIAZIDE 25 MG TABLET
ORAL_TABLET | Freq: Every day | ORAL | 3 refills | 90.00000 days | Status: CP
Start: 2020-12-05 — End: 2021-12-05

## 2020-12-08 ENCOUNTER — Encounter: Admit: 2020-12-08 | Discharge: 2020-12-09 | Payer: PRIVATE HEALTH INSURANCE

## 2020-12-08 DIAGNOSIS — Z940 Kidney transplant status: Principal | ICD-10-CM

## 2020-12-08 DIAGNOSIS — D849 Immunodeficiency, unspecified: Principal | ICD-10-CM

## 2020-12-22 DIAGNOSIS — Z940 Kidney transplant status: Principal | ICD-10-CM

## 2020-12-22 DIAGNOSIS — D849 Immunodeficiency, unspecified: Principal | ICD-10-CM

## 2021-01-05 DIAGNOSIS — Z940 Kidney transplant status: Principal | ICD-10-CM

## 2021-01-05 DIAGNOSIS — D849 Immunodeficiency, unspecified: Principal | ICD-10-CM

## 2021-01-07 DIAGNOSIS — Z940 Kidney transplant status: Principal | ICD-10-CM

## 2021-01-07 DIAGNOSIS — D849 Immunodeficiency, unspecified: Principal | ICD-10-CM

## 2021-01-07 MED ORDER — GABAPENTIN 100 MG CAPSULE
ORAL_CAPSULE | Freq: Every evening | ORAL | 3 refills | 90 days | Status: CP
Start: 2021-01-07 — End: 2022-01-07

## 2021-01-12 ENCOUNTER — Encounter: Admit: 2021-01-12 | Discharge: 2021-01-12 | Payer: PRIVATE HEALTH INSURANCE

## 2021-01-12 ENCOUNTER — Ambulatory Visit: Admit: 2021-01-12 | Discharge: 2021-01-12 | Payer: PRIVATE HEALTH INSURANCE

## 2021-01-12 DIAGNOSIS — Z940 Kidney transplant status: Principal | ICD-10-CM

## 2021-01-12 DIAGNOSIS — Z79899 Other long term (current) drug therapy: Principal | ICD-10-CM

## 2021-01-12 DIAGNOSIS — D849 Immunodeficiency, unspecified: Principal | ICD-10-CM

## 2021-01-12 DIAGNOSIS — E785 Hyperlipidemia, unspecified: Principal | ICD-10-CM

## 2021-01-12 DIAGNOSIS — Z20822 Contact with and (suspected) exposure to covid-19: Secondary | ICD-10-CM | POA: Diagnosis not present

## 2021-01-15 DIAGNOSIS — Z940 Kidney transplant status: Principal | ICD-10-CM

## 2021-01-15 MED ORDER — SIROLIMUS 1 MG TABLET
ORAL_TABLET | 2 refills | 0 days | Status: CP
Start: 2021-01-15 — End: ?

## 2021-01-19 DIAGNOSIS — Z940 Kidney transplant status: Principal | ICD-10-CM

## 2021-01-19 DIAGNOSIS — D849 Immunodeficiency, unspecified: Principal | ICD-10-CM

## 2021-01-20 MED ORDER — OMEGA-3 ACID ETHYL ESTERS 1 GRAM CAPSULE
ORAL_CAPSULE | Freq: Every day | ORAL | 3 refills | 0.00000 days | Status: CP
Start: 2021-01-20 — End: 2022-01-20

## 2021-01-20 MED ORDER — GABAPENTIN 100 MG CAPSULE
ORAL_CAPSULE | Freq: Every evening | ORAL | 3 refills | 90.00000 days | Status: CP
Start: 2021-01-20 — End: 2022-01-20

## 2021-01-20 MED ORDER — AMLODIPINE 5 MG TABLET
ORAL_TABLET | Freq: Two times a day (BID) | ORAL | 1 refills | 90 days | Status: CP
Start: 2021-01-20 — End: 2021-07-19

## 2021-01-22 ENCOUNTER — Encounter (HOSPITAL_BASED_OUTPATIENT_CLINIC_OR_DEPARTMENT_OTHER): Payer: Self-pay | Admitting: Obstetrics & Gynecology

## 2021-01-22 ENCOUNTER — Other Ambulatory Visit (HOSPITAL_COMMUNITY)
Admission: RE | Admit: 2021-01-22 | Discharge: 2021-01-22 | Disposition: A | Discharge status: Home / Self Care | Payer: BC Managed Care – PPO | Source: Ambulatory Visit | Attending: Obstetrics & Gynecology | Admitting: Obstetrics & Gynecology

## 2021-01-22 ENCOUNTER — Other Ambulatory Visit (HOSPITAL_BASED_OUTPATIENT_CLINIC_OR_DEPARTMENT_OTHER)
Admission: RE | Admit: 2021-01-22 | Discharge: 2021-01-22 | Disposition: A | Discharge status: Home / Self Care | Payer: BC Managed Care – PPO | Source: Ambulatory Visit | Attending: Obstetrics & Gynecology | Admitting: Obstetrics & Gynecology

## 2021-01-22 ENCOUNTER — Other Ambulatory Visit (HOSPITAL_BASED_OUTPATIENT_CLINIC_OR_DEPARTMENT_OTHER): Payer: Self-pay

## 2021-01-22 ENCOUNTER — Ambulatory Visit (INDEPENDENT_AMBULATORY_CARE_PROVIDER_SITE_OTHER): Payer: BC Managed Care – PPO | Admitting: Obstetrics & Gynecology

## 2021-01-22 ENCOUNTER — Other Ambulatory Visit: Payer: Self-pay

## 2021-01-22 VITALS — BP 128/87 | HR 71 | Ht 66.0 in | Wt 174.0 lb

## 2021-01-22 DIAGNOSIS — Z01419 Encounter for gynecological examination (general) (routine) without abnormal findings: Secondary | ICD-10-CM | POA: Diagnosis not present

## 2021-01-22 DIAGNOSIS — Z124 Encounter for screening for malignant neoplasm of cervix: Secondary | ICD-10-CM | POA: Diagnosis not present

## 2021-01-22 DIAGNOSIS — I6350 Cerebral infarction due to unspecified occlusion or stenosis of unspecified cerebral artery: Secondary | ICD-10-CM

## 2021-01-22 DIAGNOSIS — R3915 Urgency of urination: Secondary | ICD-10-CM | POA: Diagnosis not present

## 2021-01-22 DIAGNOSIS — K5730 Diverticulosis of large intestine without perforation or abscess without bleeding: Secondary | ICD-10-CM | POA: Insufficient documentation

## 2021-01-22 DIAGNOSIS — N3281 Overactive bladder: Secondary | ICD-10-CM

## 2021-01-22 DIAGNOSIS — N812 Incomplete uterovaginal prolapse: Secondary | ICD-10-CM

## 2021-01-22 DIAGNOSIS — Q612 Polycystic kidney, adult type: Secondary | ICD-10-CM

## 2021-01-22 DIAGNOSIS — Z940 Kidney transplant status: Secondary | ICD-10-CM | POA: Diagnosis not present

## 2021-01-22 DIAGNOSIS — D509 Iron deficiency anemia, unspecified: Secondary | ICD-10-CM | POA: Insufficient documentation

## 2021-01-22 MED ORDER — OMEGA-3 ACID ETHYL ESTERS 1 GRAM CAPSULE
ORAL_CAPSULE | 3 refills | 0 days | Status: CP
Start: 2021-01-22 — End: ?
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
## 2021-01-23 LAB — CYTOLOGY - PAP: Diagnosis: NEGATIVE

## 2021-01-24 LAB — URINE CULTURE: Culture: NO GROWTH

## 2021-01-26 ENCOUNTER — Encounter (HOSPITAL_BASED_OUTPATIENT_CLINIC_OR_DEPARTMENT_OTHER): Payer: Self-pay

## 2021-02-02 ENCOUNTER — Ambulatory Visit: Payer: Medicare Other

## 2021-02-02 DIAGNOSIS — D849 Immunodeficiency, unspecified: Principal | ICD-10-CM

## 2021-02-02 DIAGNOSIS — Z940 Kidney transplant status: Principal | ICD-10-CM

## 2021-02-16 DIAGNOSIS — D849 Immunodeficiency, unspecified: Principal | ICD-10-CM

## 2021-02-16 DIAGNOSIS — Z940 Kidney transplant status: Principal | ICD-10-CM

## 2021-02-20 ENCOUNTER — Ambulatory Visit (HOSPITAL_BASED_OUTPATIENT_CLINIC_OR_DEPARTMENT_OTHER): Payer: BC Managed Care – PPO

## 2021-02-22 ENCOUNTER — Other Ambulatory Visit: Payer: Self-pay | Admitting: Obstetrics & Gynecology

## 2021-02-23 ENCOUNTER — Encounter (HOSPITAL_BASED_OUTPATIENT_CLINIC_OR_DEPARTMENT_OTHER): Payer: Self-pay

## 2021-02-24 ENCOUNTER — Ambulatory Visit (INDEPENDENT_AMBULATORY_CARE_PROVIDER_SITE_OTHER): Payer: BC Managed Care – PPO | Admitting: *Deleted

## 2021-02-24 ENCOUNTER — Other Ambulatory Visit: Payer: Self-pay

## 2021-02-24 ENCOUNTER — Encounter (HOSPITAL_BASED_OUTPATIENT_CLINIC_OR_DEPARTMENT_OTHER): Payer: Self-pay | Admitting: Obstetrics & Gynecology

## 2021-02-24 DIAGNOSIS — Z23 Encounter for immunization: Secondary | ICD-10-CM

## 2021-02-24 NOTE — Progress Notes (Signed)
Pt came to update Tdap vaccination.

## 2021-02-24 NOTE — Progress Notes (Signed)
Pt here for Tdap vaccine. Tolerated well.

## 2021-02-27 ENCOUNTER — Encounter: Admit: 2021-02-27 | Discharge: 2021-02-27 | Payer: PRIVATE HEALTH INSURANCE

## 2021-02-27 ENCOUNTER — Ambulatory Visit: Admit: 2021-02-27 | Discharge: 2021-02-27 | Payer: PRIVATE HEALTH INSURANCE

## 2021-02-27 ENCOUNTER — Other Ambulatory Visit (HOSPITAL_BASED_OUTPATIENT_CLINIC_OR_DEPARTMENT_OTHER): Payer: Self-pay | Admitting: Obstetrics & Gynecology

## 2021-02-27 ENCOUNTER — Encounter
Admit: 2021-02-27 | Discharge: 2021-02-27 | Payer: PRIVATE HEALTH INSURANCE | Attending: Nephrology | Primary: Nephrology

## 2021-02-27 DIAGNOSIS — E119 Type 2 diabetes mellitus without complications: Principal | ICD-10-CM

## 2021-02-27 DIAGNOSIS — Z79899 Other long term (current) drug therapy: Principal | ICD-10-CM

## 2021-02-27 DIAGNOSIS — Z940 Kidney transplant status: Principal | ICD-10-CM

## 2021-02-27 DIAGNOSIS — G629 Polyneuropathy, unspecified: Principal | ICD-10-CM

## 2021-02-27 DIAGNOSIS — E1140 Type 2 diabetes mellitus with diabetic neuropathy, unspecified: Secondary | ICD-10-CM | POA: Diagnosis not present

## 2021-02-27 DIAGNOSIS — N1330 Unspecified hydronephrosis: Secondary | ICD-10-CM | POA: Diagnosis not present

## 2021-02-27 DIAGNOSIS — U071 COVID-19: Secondary | ICD-10-CM | POA: Diagnosis not present

## 2021-02-27 DIAGNOSIS — R0989 Other specified symptoms and signs involving the circulatory and respiratory systems: Secondary | ICD-10-CM | POA: Diagnosis not present

## 2021-02-27 DIAGNOSIS — D649 Anemia, unspecified: Secondary | ICD-10-CM | POA: Diagnosis not present

## 2021-02-27 DIAGNOSIS — D849 Immunodeficiency, unspecified: Secondary | ICD-10-CM | POA: Diagnosis not present

## 2021-02-27 DIAGNOSIS — Z6827 Body mass index (BMI) 27.0-27.9, adult: Secondary | ICD-10-CM | POA: Diagnosis not present

## 2021-02-27 DIAGNOSIS — E876 Hypokalemia: Secondary | ICD-10-CM | POA: Diagnosis not present

## 2021-02-27 DIAGNOSIS — E871 Hypo-osmolality and hyponatremia: Secondary | ICD-10-CM | POA: Diagnosis not present

## 2021-02-27 DIAGNOSIS — Q613 Polycystic kidney, unspecified: Secondary | ICD-10-CM | POA: Diagnosis not present

## 2021-02-27 DIAGNOSIS — N951 Menopausal and female climacteric states: Secondary | ICD-10-CM | POA: Diagnosis not present

## 2021-02-27 DIAGNOSIS — Z780 Asymptomatic menopausal state: Secondary | ICD-10-CM | POA: Diagnosis not present

## 2021-02-27 DIAGNOSIS — E785 Hyperlipidemia, unspecified: Secondary | ICD-10-CM | POA: Diagnosis not present

## 2021-02-27 DIAGNOSIS — M8589 Other specified disorders of bone density and structure, multiple sites: Secondary | ICD-10-CM | POA: Diagnosis not present

## 2021-02-27 MED ORDER — GEMTESA 75 MG PO TABS: 1.0000 | ORAL_TABLET | Freq: Every day | ORAL | 3 refills | Status: DC

## 2021-02-27 MED ORDER — OLMESARTAN 20 MG TABLET
ORAL_TABLET | Freq: Every day | ORAL | 3 refills | 30 days | Status: CP
Start: 2021-02-27 — End: 2022-02-27

## 2021-03-02 DIAGNOSIS — D849 Immunodeficiency, unspecified: Principal | ICD-10-CM

## 2021-03-02 DIAGNOSIS — Z940 Kidney transplant status: Principal | ICD-10-CM

## 2021-03-11 ENCOUNTER — Other Ambulatory Visit (HOSPITAL_BASED_OUTPATIENT_CLINIC_OR_DEPARTMENT_OTHER): Payer: Self-pay | Admitting: Obstetrics & Gynecology

## 2021-03-11 ENCOUNTER — Encounter (HOSPITAL_BASED_OUTPATIENT_CLINIC_OR_DEPARTMENT_OTHER): Payer: Self-pay | Admitting: *Deleted

## 2021-03-11 ENCOUNTER — Telehealth (HOSPITAL_BASED_OUTPATIENT_CLINIC_OR_DEPARTMENT_OTHER): Payer: Self-pay | Admitting: *Deleted

## 2021-03-11 MED ORDER — MIRABEGRON ER 25 MG PO TB24: 25.0000 mg | ORAL_TABLET | Freq: Every day | ORAL | 1 refills | Status: DC

## 2021-03-11 NOTE — Telephone Encounter (Signed)
Pt requests refill on myrbetriq while she waits on the approval of gemtesa. Advised we would send in a one month refill until we hear from her insurance.

## 2021-03-16 DIAGNOSIS — D849 Immunodeficiency, unspecified: Principal | ICD-10-CM

## 2021-03-16 DIAGNOSIS — Z940 Kidney transplant status: Principal | ICD-10-CM

## 2021-03-19 DIAGNOSIS — M792 Neuralgia and neuritis, unspecified: Principal | ICD-10-CM

## 2021-03-23 ENCOUNTER — Encounter (HOSPITAL_BASED_OUTPATIENT_CLINIC_OR_DEPARTMENT_OTHER): Payer: Self-pay

## 2021-03-28 ENCOUNTER — Other Ambulatory Visit: Payer: Self-pay | Admitting: Obstetrics & Gynecology

## 2021-03-30 DIAGNOSIS — D849 Immunodeficiency, unspecified: Principal | ICD-10-CM

## 2021-03-30 DIAGNOSIS — Z940 Kidney transplant status: Principal | ICD-10-CM

## 2021-03-31 DIAGNOSIS — D849 Immunodeficiency, unspecified: Secondary | ICD-10-CM | POA: Diagnosis not present

## 2021-03-31 DIAGNOSIS — Z940 Kidney transplant status: Secondary | ICD-10-CM | POA: Diagnosis not present

## 2021-04-02 DIAGNOSIS — Z86010 Personal history of colonic polyps: Secondary | ICD-10-CM | POA: Diagnosis not present

## 2021-04-02 DIAGNOSIS — K219 Gastro-esophageal reflux disease without esophagitis: Secondary | ICD-10-CM | POA: Diagnosis not present

## 2021-04-02 DIAGNOSIS — R102 Pelvic and perineal pain: Secondary | ICD-10-CM | POA: Diagnosis not present

## 2021-04-02 DIAGNOSIS — K5730 Diverticulosis of large intestine without perforation or abscess without bleeding: Secondary | ICD-10-CM | POA: Diagnosis not present

## 2021-04-03 ENCOUNTER — Other Ambulatory Visit (HOSPITAL_BASED_OUTPATIENT_CLINIC_OR_DEPARTMENT_OTHER): Payer: Self-pay | Admitting: *Deleted

## 2021-04-03 MED ORDER — MIRABEGRON ER 50 MG PO TB24: 50.0000 mg | ORAL_TABLET | Freq: Every day | ORAL | 0 refills | Status: DC

## 2021-04-03 MED ORDER — MYCOPHENOLATE SODIUM 180 MG TABLET,DELAYED RELEASE
ORAL_TABLET | ORAL | 3 refills | 90 days | Status: CP
Start: 2021-04-03 — End: 2022-04-03

## 2021-04-13 DIAGNOSIS — D849 Immunodeficiency, unspecified: Principal | ICD-10-CM

## 2021-04-13 DIAGNOSIS — Z940 Kidney transplant status: Principal | ICD-10-CM

## 2021-04-15 DIAGNOSIS — Z940 Kidney transplant status: Principal | ICD-10-CM

## 2021-04-17 ENCOUNTER — Encounter (HOSPITAL_BASED_OUTPATIENT_CLINIC_OR_DEPARTMENT_OTHER): Payer: Self-pay

## 2021-04-18 ENCOUNTER — Other Ambulatory Visit (HOSPITAL_BASED_OUTPATIENT_CLINIC_OR_DEPARTMENT_OTHER): Payer: Self-pay | Admitting: Obstetrics & Gynecology

## 2021-04-18 DIAGNOSIS — R102 Pelvic and perineal pain: Secondary | ICD-10-CM

## 2021-04-23 ENCOUNTER — Other Ambulatory Visit: Payer: Self-pay

## 2021-04-23 ENCOUNTER — Ambulatory Visit (HOSPITAL_BASED_OUTPATIENT_CLINIC_OR_DEPARTMENT_OTHER)
Admission: RE | Admit: 2021-04-23 | Discharge: 2021-04-23 | Disposition: A | Discharge status: Home / Self Care | Payer: BC Managed Care – PPO | Source: Ambulatory Visit | Attending: Obstetrics & Gynecology | Admitting: Obstetrics & Gynecology

## 2021-04-23 DIAGNOSIS — R102 Pelvic and perineal pain: Secondary | ICD-10-CM

## 2021-04-23 DIAGNOSIS — D259 Leiomyoma of uterus, unspecified: Secondary | ICD-10-CM | POA: Diagnosis not present

## 2021-04-27 DIAGNOSIS — D849 Immunodeficiency, unspecified: Principal | ICD-10-CM

## 2021-04-27 DIAGNOSIS — Z940 Kidney transplant status: Principal | ICD-10-CM

## 2021-04-30 ENCOUNTER — Encounter (HOSPITAL_BASED_OUTPATIENT_CLINIC_OR_DEPARTMENT_OTHER): Payer: Self-pay

## 2021-05-11 DIAGNOSIS — Z940 Kidney transplant status: Principal | ICD-10-CM

## 2021-05-11 DIAGNOSIS — D849 Immunodeficiency, unspecified: Principal | ICD-10-CM

## 2021-05-25 DIAGNOSIS — Z940 Kidney transplant status: Principal | ICD-10-CM

## 2021-05-25 DIAGNOSIS — D849 Immunodeficiency, unspecified: Principal | ICD-10-CM

## 2021-05-30 MED ORDER — OLMESARTAN 20 MG TABLET
ORAL_TABLET | 1 refills | 0.00000 days
Start: 2021-05-30 — End: ?

## 2021-06-08 DIAGNOSIS — D849 Immunodeficiency, unspecified: Principal | ICD-10-CM

## 2021-06-08 DIAGNOSIS — Z940 Kidney transplant status: Principal | ICD-10-CM

## 2021-06-09 ENCOUNTER — Other Ambulatory Visit (HOSPITAL_BASED_OUTPATIENT_CLINIC_OR_DEPARTMENT_OTHER): Payer: Self-pay | Admitting: Obstetrics & Gynecology

## 2021-06-09 ENCOUNTER — Encounter (HOSPITAL_COMMUNITY): Payer: Self-pay | Admitting: *Deleted

## 2021-06-09 DIAGNOSIS — N812 Incomplete uterovaginal prolapse: Secondary | ICD-10-CM

## 2021-06-09 DIAGNOSIS — N3281 Overactive bladder: Secondary | ICD-10-CM

## 2021-06-15 DIAGNOSIS — Z48298 Encounter for aftercare following other organ transplant: Principal | ICD-10-CM

## 2021-06-16 ENCOUNTER — Other Ambulatory Visit: Payer: Self-pay

## 2021-06-16 ENCOUNTER — Encounter (HOSPITAL_COMMUNITY): Payer: Self-pay | Admitting: *Deleted

## 2021-06-16 ENCOUNTER — Encounter: Payer: Self-pay | Admitting: Neurology

## 2021-06-16 ENCOUNTER — Ambulatory Visit (INDEPENDENT_AMBULATORY_CARE_PROVIDER_SITE_OTHER): Payer: 59 | Admitting: Neurology

## 2021-06-16 VITALS — BP 132/85 | HR 81 | Ht 66.0 in | Wt 176.5 lb

## 2021-06-16 DIAGNOSIS — R202 Paresthesia of skin: Secondary | ICD-10-CM | POA: Diagnosis not present

## 2021-06-16 MED ORDER — LIDOCAINE-PRILOCAINE 2.5-2.5 % EX CREA: TOPICAL_CREAM | CUTANEOUS | 11 refills | Status: DC

## 2021-06-16 MED ORDER — DICLOFENAC SODIUM 3 % EX GEL: CUTANEOUS | 11 refills | Status: DC
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
## 2021-06-17 ENCOUNTER — Telehealth: Payer: Self-pay

## 2021-06-17 MED ORDER — CARVEDILOL 6.25 MG TABLET
ORAL_TABLET | 3 refills | 0 days | Status: CP
Start: 2021-06-17 — End: ?

## 2021-06-17 NOTE — Telephone Encounter (Signed)
I submitted a PA request for Diclofenac 3% gel on CMM, Key: [id number] - PA Case ID: [id number].   Awaiting determination from [facility].

## 2021-06-20 LAB — MULTIPLE MYELOMA PANEL, SERUM
Albumin SerPl Elph-Mcnc: 3.8 g/dL (ref 2.9–4.4)
Albumin/Glob SerPl: 1.3 (ref 0.7–1.7)
Alpha 1: 0.3 g/dL (ref 0.0–0.4)
Alpha2 Glob SerPl Elph-Mcnc: 0.9 g/dL (ref 0.4–1.0)
B-Globulin SerPl Elph-Mcnc: 1.2 g/dL (ref 0.7–1.3)
Gamma Glob SerPl Elph-Mcnc: 0.7 g/dL (ref 0.4–1.8)
Globulin, Total: 3.1 g/dL (ref 2.2–3.9)
IgA/Immunoglobulin A, Serum: 202 mg/dL (ref 87–352)
IgG (Immunoglobin G), Serum: 788 mg/dL (ref 586–1602)
IgM (Immunoglobulin M), Srm: 29 mg/dL (ref 26–217)
Total Protein: 6.9 g/dL (ref 6.0–8.5)

## 2021-06-20 LAB — VITAMIN D 25 HYDROXY (VIT D DEFICIENCY, FRACTURES): Vit D, 25-Hydroxy: 43.2 ng/mL (ref 30.0–100.0)

## 2021-06-20 LAB — IRON,TIBC AND FERRITIN PANEL
Ferritin: 77 ng/mL (ref 15–150)
Iron Saturation: 17 % (ref 15–55)
Iron: 48 ug/dL (ref 27–139)
Total Iron Binding Capacity: 278 ug/dL (ref 250–450)
UIBC: 230 ug/dL (ref 118–369)

## 2021-06-20 LAB — COPPER, SERUM: Copper: 120 ug/dL (ref 80–158)

## 2021-06-20 LAB — LYME DISEASE SEROLOGY W/REFLEX: Lyme Total Antibody EIA: NEGATIVE

## 2021-06-22 ENCOUNTER — Encounter (HOSPITAL_BASED_OUTPATIENT_CLINIC_OR_DEPARTMENT_OTHER): Payer: Self-pay

## 2021-06-22 DIAGNOSIS — D849 Immunodeficiency, unspecified: Principal | ICD-10-CM

## 2021-06-22 DIAGNOSIS — Z940 Kidney transplant status: Principal | ICD-10-CM

## 2021-06-23 ENCOUNTER — Other Ambulatory Visit (HOSPITAL_BASED_OUTPATIENT_CLINIC_OR_DEPARTMENT_OTHER): Payer: Self-pay | Admitting: *Deleted

## 2021-06-23 MED ORDER — MIRABEGRON ER 50 MG PO TB24: 50.0000 mg | ORAL_TABLET | Freq: Every day | ORAL | 0 refills | Status: DC

## 2021-06-23 NOTE — Telephone Encounter (Signed)
Let her buy over the counter 1% diclofenac

## 2021-06-23 NOTE — Telephone Encounter (Signed)
Diclofenac 3% gel is over covered by her plan for the following  reasons: actinic keratosis, actinic cheilitis, or disseminated superficial actinic porokeratosis.  Even with a goodrx coupon, the cost is still expensive. She is going to try OTC diclofenac 1% gel to see if it is helpful.

## 2021-06-23 NOTE — Addendum Note (Signed)
Addended by: Desmond Lope on: 06/23/2021 04:24 PM   Modules accepted: Orders

## 2021-06-29 DIAGNOSIS — Z940 Kidney transplant status: Principal | ICD-10-CM

## 2021-06-29 MED ORDER — SIROLIMUS 1 MG TABLET
ORAL_TABLET | Freq: Every day | ORAL | 2 refills | 90 days | Status: CP
Start: 2021-06-29 — End: 2022-06-29

## 2021-06-29 MED ORDER — MYCOPHENOLATE SODIUM 180 MG TABLET,DELAYED RELEASE
ORAL_TABLET | ORAL | 3 refills | 90.00000 days | Status: CP
Start: 2021-06-29 — End: 2022-06-29

## 2021-07-01 DIAGNOSIS — Z940 Kidney transplant status: Principal | ICD-10-CM

## 2021-07-01 MED ORDER — MYFORTIC 180 MG TABLET,DELAYED RELEASE
ORAL_TABLET | 3 refills | 0 days | Status: CP
Start: 2021-07-01 — End: ?

## 2021-07-02 ENCOUNTER — Ambulatory Visit: Admit: 2021-07-02 | Discharge: 2021-07-03 | Payer: PRIVATE HEALTH INSURANCE

## 2021-07-02 DIAGNOSIS — D849 Immunodeficiency, unspecified: Principal | ICD-10-CM

## 2021-07-02 DIAGNOSIS — Z940 Kidney transplant status: Principal | ICD-10-CM

## 2021-07-02 MED ORDER — POTASSIUM CHLORIDE ER 10 MEQ TABLET, EXTENDED RELEASE WRAPPER
ORAL_TABLET | Freq: Every day | ORAL | 11 refills | 30.00000 days | Status: CP
Start: 2021-07-02 — End: 2022-07-02

## 2021-07-06 DIAGNOSIS — Z940 Kidney transplant status: Principal | ICD-10-CM

## 2021-07-06 DIAGNOSIS — D849 Immunodeficiency, unspecified: Principal | ICD-10-CM

## 2021-07-10 DIAGNOSIS — E7800 Pure hypercholesterolemia, unspecified: Principal | ICD-10-CM

## 2021-07-10 MED ORDER — OMEGA-3 ACID ETHYL ESTERS 1 GRAM CAPSULE
ORAL_CAPSULE | Freq: Every day | ORAL | 3 refills | 90.00000 days | Status: CP
Start: 2021-07-10 — End: 2022-07-10

## 2021-07-10 MED ORDER — EZETIMIBE 10 MG TABLET
ORAL_TABLET | Freq: Every day | ORAL | 3 refills | 90.00000 days | Status: CP
Start: 2021-07-10 — End: 2022-07-10

## 2021-07-10 MED ORDER — GABAPENTIN 100 MG CAPSULE
ORAL_CAPSULE | Freq: Every evening | ORAL | 3 refills | 90 days | Status: CP
Start: 2021-07-10 — End: 2022-07-10

## 2021-07-10 MED ORDER — ATORVASTATIN 10 MG TABLET
ORAL_TABLET | Freq: Every day | ORAL | 3 refills | 90.00000 days | Status: CP
Start: 2021-07-10 — End: 2022-07-10

## 2021-07-12 MED ORDER — EZETIMIBE 10 MG TABLET
ORAL_TABLET | 3 refills | 0 days | Status: CP
Start: 2021-07-12 — End: ?

## 2021-07-13 ENCOUNTER — Encounter (HOSPITAL_BASED_OUTPATIENT_CLINIC_OR_DEPARTMENT_OTHER): Payer: Self-pay | Admitting: *Deleted

## 2021-07-14 ENCOUNTER — Encounter (HOSPITAL_BASED_OUTPATIENT_CLINIC_OR_DEPARTMENT_OTHER): Payer: Self-pay

## 2021-07-14 MED ORDER — GABAPENTIN 100 MG CAPSULE
ORAL_CAPSULE | Freq: Every evening | ORAL | 3 refills | 90 days | Status: CP
Start: 2021-07-14 — End: 2022-07-14

## 2021-07-20 DIAGNOSIS — Z940 Kidney transplant status: Principal | ICD-10-CM

## 2021-07-20 DIAGNOSIS — D849 Immunodeficiency, unspecified: Principal | ICD-10-CM

## 2021-08-03 ENCOUNTER — Encounter (HOSPITAL_BASED_OUTPATIENT_CLINIC_OR_DEPARTMENT_OTHER): Payer: Self-pay | Admitting: Obstetrics & Gynecology

## 2021-08-03 ENCOUNTER — Ambulatory Visit (INDEPENDENT_AMBULATORY_CARE_PROVIDER_SITE_OTHER): Payer: Managed Care, Other (non HMO) | Admitting: Obstetrics & Gynecology

## 2021-08-03 ENCOUNTER — Other Ambulatory Visit: Payer: Self-pay

## 2021-08-03 ENCOUNTER — Encounter (HOSPITAL_COMMUNITY): Payer: Self-pay | Admitting: *Deleted

## 2021-08-03 VITALS — BP 159/78 | HR 67 | Ht 67.0 in | Wt 175.2 lb

## 2021-08-03 DIAGNOSIS — D849 Immunodeficiency, unspecified: Principal | ICD-10-CM

## 2021-08-03 DIAGNOSIS — Z940 Kidney transplant status: Principal | ICD-10-CM

## 2021-08-03 DIAGNOSIS — Z960 Presence of urogenital implants: Secondary | ICD-10-CM | POA: Diagnosis not present

## 2021-08-03 DIAGNOSIS — N3281 Overactive bladder: Secondary | ICD-10-CM

## 2021-08-03 DIAGNOSIS — N812 Incomplete uterovaginal prolapse: Secondary | ICD-10-CM

## 2021-08-03 NOTE — Patient Instructions (Signed)
Linda Gilmore.  Acupuncture   [phone]  Address: [address], [address], [place], [place] [address]
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
## 2021-08-04 ENCOUNTER — Encounter (HOSPITAL_COMMUNITY): Payer: Self-pay | Admitting: *Deleted

## 2021-08-05 ENCOUNTER — Encounter (HOSPITAL_COMMUNITY): Payer: Self-pay | Admitting: *Deleted

## 2021-08-05 ENCOUNTER — Other Ambulatory Visit: Payer: Self-pay

## 2021-08-05 ENCOUNTER — Ambulatory Visit (INDEPENDENT_AMBULATORY_CARE_PROVIDER_SITE_OTHER): Payer: Managed Care, Other (non HMO) | Admitting: Neurology

## 2021-08-05 ENCOUNTER — Encounter (INDEPENDENT_AMBULATORY_CARE_PROVIDER_SITE_OTHER): Payer: Managed Care, Other (non HMO) | Admitting: Neurology

## 2021-08-05 DIAGNOSIS — Z0289 Encounter for other administrative examinations: Secondary | ICD-10-CM

## 2021-08-05 DIAGNOSIS — R202 Paresthesia of skin: Secondary | ICD-10-CM

## 2021-08-05 NOTE — Procedures (Signed)
Full Name: Linda Gilmore Gender: Female MRN #: [id number] Date of Birth: 1956/05/03    Visit Date: 08/05/2021 08:37 Age: 65 Years Examining Physician: Marcial Pacas, MD  Referring Physician: Marcial Pacas, MD History: 65 year old female with history of kidney transplant, presenting with bilateral toes feet paresthesia  Summary of the test: Nerve conduction study: Bilateral superficial peroneal sensory responses showed mild to moderately decreased snap amplitude.  Bilateral sural sensory responses were normal.  Bilateral tibial, peroneal to EDB motor responses were normal.  Electromyography: Selected needle examination of bilateral lower extremity muscles, and bilateral lumbosacral paraspinal muscles were normal.   Conclusion: This is a mild abnormal study.  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  There is no evidence of bilateral lumbosacral radiculopathy.    ------------------------------- Marcial Pacas M.D. PhD  [facility] [address] [place], [place] [address] Tel: [phone] Fax: [phone]  Verbal informed consent was obtained from the patient, patient was informed of potential risk of procedure, including bruising, bleeding, hematoma formation, infection, muscle weakness, muscle pain, numbness, among others.        [place]    Nerve / Sites Muscle Latency Ref. Amplitude Ref. Rel Amp Segments Distance Velocity Ref. Area    ms ms mV mV %  cm m/s m/s mVms  R Peroneal - EDB     Ankle EDB 4.5 ?6.5 4.8 ?2.0 100 Ankle - EDB 9   20.0     Fib head EDB 11.7  4.4  91.5 Fib head - Ankle 32 44 ?44 25.0     Pop fossa EDB 14.0  4.4  100 Pop fossa - Fib head 10 44 ?44 25.4         Pop fossa - Ankle      L Peroneal - EDB     Ankle EDB 4.1 ?6.5 2.2 ?2.0 100 Ankle - EDB 9   7.9     Fib head EDB 11.3  2.0  91 Fib head - Ankle 32 44 ?44 8.3     Pop fossa EDB 13.6  2.0  101 Pop fossa - Fib  head 10 44 ?44 8.2         Pop fossa - Ankle      R Tibial - AH     Ankle AH 3.4 ?5.8 5.3 ?4.0 100 Ankle - AH 9   15.6     Pop fossa AH 13.5  4.1  78.6 Pop fossa - Ankle 41 41 ?41 17.1  L Tibial - AH     Ankle AH 3.7 ?5.8 5.7 ?4.0 100 Ankle - AH 9   17.4     Pop fossa AH 13.7  4.5  79.9 Pop fossa - Ankle 41 41 ?41 16.0             SNC    Nerve / Sites Rec. Site Peak Lat Ref.  Amp Ref. Segments Distance    ms ms V V  cm  R Sural - Ankle (Calf)     Calf Ankle 4.4 ?4.4 6 ?6 Calf - Ankle 14  L Sural - Ankle (Calf)     Calf Ankle 3.0 ?4.4 8 ?6 Calf - Ankle 14  R Superficial peroneal - Ankle     Lat leg Ankle 3.4 ?4.4 4 ?6 Lat leg - Ankle 14  L Superficial peroneal - Ankle     Lat leg Ankle 3.4 ?4.4 3 ?6 Lat leg -  Ankle 14             F  Wave    Nerve F Lat Ref.   ms ms  R Tibial - AH 51.3 ?56.0  L Tibial - AH 51.1 ?56.0         EMG Summary Table    Spontaneous MUAP Recruitment  Muscle IA Fib PSW Fasc Other Amp Dur. Poly Pattern  L. Tibialis anterior Normal None None None _______ Normal Normal Normal Normal  L. Tibialis posterior Normal None None None _______ Normal Normal Normal Normal  L. Peroneus longus Normal None None None _______ Normal Normal Normal Normal  L. Gastrocnemius (Medial head) Normal None None None _______ Normal Normal Normal Normal  L. Vastus lateralis Normal None None None _______ Normal Normal Normal Normal  R. Tibialis anterior Normal None None None _______ Normal Normal Normal Normal  R. Tibialis posterior Normal None None None _______ Normal Normal Normal Normal  R. Gastrocnemius (Medial head) Normal None None None _______ Normal Normal Normal Normal  R. Peroneus longus Normal None None None _______ Normal Normal Normal Normal  R. Vastus lateralis Normal None None None _______ Normal Normal Normal Normal  R. Lumbar paraspinals (mid) Normal None None None _______ Normal Normal Normal Normal  R. Lumbar paraspinals (low) Normal None None None _______  Normal Normal Normal Normal  L. Lumbar paraspinals (low) Normal None None None _______ Normal Normal Normal Normal  L. Lumbar paraspinals (mid) Normal None None None _______ Normal Normal Normal Normal

## 2021-08-11 DIAGNOSIS — Z940 Kidney transplant status: Principal | ICD-10-CM

## 2021-08-12 ENCOUNTER — Encounter (HOSPITAL_BASED_OUTPATIENT_CLINIC_OR_DEPARTMENT_OTHER): Payer: Self-pay

## 2021-08-13 ENCOUNTER — Other Ambulatory Visit (HOSPITAL_BASED_OUTPATIENT_CLINIC_OR_DEPARTMENT_OTHER): Payer: Self-pay | Admitting: *Deleted

## 2021-08-13 DIAGNOSIS — Z940 Kidney transplant status: Principal | ICD-10-CM

## 2021-08-13 DIAGNOSIS — D849 Immunodeficiency, unspecified: Principal | ICD-10-CM

## 2021-08-14 ENCOUNTER — Other Ambulatory Visit (HOSPITAL_BASED_OUTPATIENT_CLINIC_OR_DEPARTMENT_OTHER): Payer: Self-pay | Admitting: Obstetrics & Gynecology

## 2021-08-14 MED ORDER — LIDOCAINE 5 % EX OINT: TOPICAL_OINTMENT | CUTANEOUS | 0 refills | Status: AC

## 2021-08-14 MED ORDER — HYDROCORTISONE (PERIANAL) 2.5 % EX CREA: TOPICAL_CREAM | Freq: Two times a day (BID) | CUTANEOUS | 1 refills | Status: AC

## 2021-08-17 DIAGNOSIS — Z940 Kidney transplant status: Principal | ICD-10-CM

## 2021-08-17 DIAGNOSIS — D849 Immunodeficiency, unspecified: Principal | ICD-10-CM

## 2021-08-24 DIAGNOSIS — D849 Immunodeficiency, unspecified: Principal | ICD-10-CM

## 2021-08-24 DIAGNOSIS — Z940 Kidney transplant status: Principal | ICD-10-CM
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
## 2021-08-28 ENCOUNTER — Ambulatory Visit (INDEPENDENT_AMBULATORY_CARE_PROVIDER_SITE_OTHER): Payer: Managed Care, Other (non HMO) | Admitting: Obstetrics and Gynecology

## 2021-08-28 ENCOUNTER — Encounter: Payer: Self-pay | Admitting: Obstetrics and Gynecology

## 2021-08-28 ENCOUNTER — Other Ambulatory Visit: Payer: Self-pay

## 2021-08-28 VITALS — BP 124/84 | HR 84 | Wt 175.0 lb

## 2021-08-28 DIAGNOSIS — R319 Hematuria, unspecified: Secondary | ICD-10-CM

## 2021-08-28 DIAGNOSIS — R3915 Urgency of urination: Secondary | ICD-10-CM | POA: Diagnosis not present

## 2021-08-28 DIAGNOSIS — R159 Full incontinence of feces: Secondary | ICD-10-CM

## 2021-08-28 DIAGNOSIS — N816 Rectocele: Secondary | ICD-10-CM

## 2021-08-28 DIAGNOSIS — R350 Frequency of micturition: Secondary | ICD-10-CM

## 2021-08-28 DIAGNOSIS — N8110 Cystocele, unspecified: Secondary | ICD-10-CM

## 2021-08-28 LAB — POCT URINALYSIS DIPSTICK
Appearance: NORMAL
Bilirubin, UA: NEGATIVE
Glucose, UA: NEGATIVE
Ketones, UA: NEGATIVE
Leukocytes, UA: NEGATIVE
Nitrite, UA: NEGATIVE
Protein, UA: NEGATIVE
Spec Grav, UA: 1.02 (ref 1.010–1.025)
Urobilinogen, UA: 0.2 E.U./dL
pH, UA: 6.5 (ref 5.0–8.0)

## 2021-08-29 LAB — URINALYSIS, MICROSCOPIC ONLY
Bacteria, UA: NONE SEEN
Casts: NONE SEEN /lpf

## 2021-09-02 LAB — URINE CULTURE

## 2021-09-07 DIAGNOSIS — Z940 Kidney transplant status: Principal | ICD-10-CM

## 2021-09-07 DIAGNOSIS — D849 Immunodeficiency, unspecified: Principal | ICD-10-CM

## 2021-09-15 ENCOUNTER — Other Ambulatory Visit: Payer: Self-pay

## 2021-09-15 ENCOUNTER — Encounter: Payer: Self-pay | Admitting: Physical Therapy

## 2021-09-15 ENCOUNTER — Ambulatory Visit: Payer: Managed Care, Other (non HMO) | Attending: Obstetrics and Gynecology | Admitting: Physical Therapy

## 2021-09-15 DIAGNOSIS — M6281 Muscle weakness (generalized): Secondary | ICD-10-CM | POA: Diagnosis present

## 2021-09-15 DIAGNOSIS — R279 Unspecified lack of coordination: Secondary | ICD-10-CM | POA: Diagnosis present

## 2021-09-15 DIAGNOSIS — R269 Unspecified abnormalities of gait and mobility: Secondary | ICD-10-CM | POA: Insufficient documentation
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
## 2021-09-15 NOTE — Patient Instructions (Signed)
Bladder Irritants  Certain foods and beverages can be irritating to the bladder.  Avoiding these irritants may decrease your symptoms of urinary urgency, frequency or bladder pain.  Even reducing your intake can help with your symptoms.  Not everyone is sensitive to all bladder irritants, so you may consider focusing on one irritant at a time, removing or reducing your intake of that irritant for 7-10 days to see if this change helps your symptoms.  Water intake is also very important.  Below is a list of bladder irritants.  Drinks: alcohol, carbonated beverages, caffeinated beverages such as coffee and tea, drinks with artificial sweeteners, citrus juices, apple juice, tomato juice  Foods: tomatoes and tomato based foods, spicy food, sugar and artificial sweeteners, vinegar, chocolate, raw onion, apples, citrus fruits, pineapple, cranberries, tomatoes, strawberries, plums, peaches, cantaloupe  Other: acidic urine (too concentrated) - see water intake info below  Substitutes you can try that are NOT irritating to the bladder: cooked onion, pears, papayas, sun-brewed decaf teas, watermelons, non-citrus herbal teas, apricots, kava and low-acid instant drinks (Postum).    WATER INTAKE: Remember to drink lots of water (aim for fluid intake of half your body weight with 2/3 of fluids being water).  You may be limiting fluids due to fear of leakage, but this can actually worsen urgency symptoms due to highly concentrated urine.  Water helps balance the pH of your urine so it doesn't become too acidic - acidic urine is a bladder irritant!  Urge Incontinence  Ideal urination frequency is every 2-4 wakeful hours, which equates to 5-8 times within a 24-hour period.   Urge incontinence is leakage that occurs when the bladder muscle contracts, creating a sudden need to go before getting to the bathroom.   Going too often when your bladder isn't actually full can disrupt the body's automatic signals to  store and hold urine longer, which will increase urgency/frequency.  In this case, the bladder "is running the show" and strategies can be learned to retrain this pattern.   One should be able to control the first urge to urinate, at around 150mL.  The bladder can hold up to a "grande latte," or 400mL. To help you gain control, practice the Urge Drill below when urgency strikes.  This drill will help retrain your bladder signals and allow you to store and hold urine longer.  The overall goal is to stretch out your time between voids to reach a more manageable voiding schedule.    Practice your "quick flicks" often throughout the day (each waking hour) even when you don't need feel the urge to go.  This will help strengthen your pelvic floor muscles, making them more effective in controlling leakage.  Urge Drill  When you feel an urge to go, follow these steps to regain control: Stop what you are doing and be still Take one deep breath, directing your air into your abdomen Think an affirming thought, such as "I've got this." Do 5 quick flicks of your pelvic floor Walk with control to the bathroom to void, or delay voiding   Relaxation Exercises with the Urge to Void   When you experience an urge to void:  FIRST  Stop and stand very still    Sit down if you can    Don't move    You need to stay very still to maintain control  SECOND Squeeze your pelvic floor muscles 5 times, like a quick flick, to keep from leaking  THIRD Relax  Take   a deep breath and then let it out  Try to make the urge go away by using relaxation and visualization techniques  FINALLY When you feel the urge go away somewhat, walk normally to the bathroom.   If the urge gets suddenly stronger on the way, you may stop again and relax to regain control.    

## 2021-09-16 MED ORDER — GABAPENTIN 100 MG CAPSULE
ORAL_CAPSULE | Freq: Every evening | ORAL | 3 refills | 90 days | Status: CP
Start: 2021-09-16 — End: 2022-09-16

## 2021-09-21 DIAGNOSIS — Z940 Kidney transplant status: Principal | ICD-10-CM

## 2021-09-21 DIAGNOSIS — D849 Immunodeficiency, unspecified: Principal | ICD-10-CM

## 2021-09-21 MED ORDER — MYFORTIC 180 MG TABLET,DELAYED RELEASE
ORAL_TABLET | Freq: Three times a day (TID) | ORAL | 11 refills | 30 days | Status: CP
Start: 2021-09-21 — End: 2022-09-21

## 2021-09-30 ENCOUNTER — Ambulatory Visit: Payer: Managed Care, Other (non HMO) | Admitting: Physical Therapy

## 2021-09-30 ENCOUNTER — Other Ambulatory Visit: Payer: Self-pay

## 2021-09-30 DIAGNOSIS — R269 Unspecified abnormalities of gait and mobility: Secondary | ICD-10-CM

## 2021-09-30 DIAGNOSIS — R279 Unspecified lack of coordination: Secondary | ICD-10-CM

## 2021-09-30 DIAGNOSIS — M6281 Muscle weakness (generalized): Secondary | ICD-10-CM
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
## 2021-10-05 ENCOUNTER — Encounter: Payer: Self-pay | Admitting: Physical Therapy

## 2021-10-05 ENCOUNTER — Encounter (HOSPITAL_BASED_OUTPATIENT_CLINIC_OR_DEPARTMENT_OTHER): Payer: Self-pay | Admitting: Obstetrics & Gynecology

## 2021-10-05 DIAGNOSIS — Z940 Kidney transplant status: Principal | ICD-10-CM

## 2021-10-05 DIAGNOSIS — D849 Immunodeficiency, unspecified: Principal | ICD-10-CM

## 2021-10-05 MED ORDER — MYFORTIC 180 MG TABLET,DELAYED RELEASE
ORAL_TABLET | Freq: Three times a day (TID) | ORAL | 11 refills | 30.00000 days | Status: CP
Start: 2021-10-05 — End: 2022-10-05

## 2021-10-07 ENCOUNTER — Encounter: Payer: Self-pay | Admitting: *Deleted

## 2021-10-13 ENCOUNTER — Encounter: Payer: Managed Care, Other (non HMO) | Admitting: Physical Therapy

## 2021-10-14 ENCOUNTER — Ambulatory Visit: Payer: Managed Care, Other (non HMO) | Attending: Obstetrics and Gynecology | Admitting: Physical Therapy

## 2021-10-14 ENCOUNTER — Other Ambulatory Visit: Payer: Self-pay

## 2021-10-14 ENCOUNTER — Encounter: Payer: Self-pay | Admitting: Obstetrics and Gynecology

## 2021-10-14 DIAGNOSIS — M6281 Muscle weakness (generalized): Secondary | ICD-10-CM | POA: Diagnosis present

## 2021-10-14 DIAGNOSIS — R279 Unspecified lack of coordination: Secondary | ICD-10-CM | POA: Diagnosis present

## 2021-10-14 DIAGNOSIS — R269 Unspecified abnormalities of gait and mobility: Secondary | ICD-10-CM | POA: Diagnosis present
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
## 2021-10-19 DIAGNOSIS — Z940 Kidney transplant status: Principal | ICD-10-CM

## 2021-10-19 DIAGNOSIS — D849 Immunodeficiency, unspecified: Principal | ICD-10-CM

## 2021-10-20 ENCOUNTER — Ambulatory Visit: Payer: Managed Care, Other (non HMO) | Admitting: Physical Therapy

## 2021-10-20 ENCOUNTER — Other Ambulatory Visit: Payer: Self-pay

## 2021-10-20 DIAGNOSIS — R269 Unspecified abnormalities of gait and mobility: Secondary | ICD-10-CM

## 2021-10-20 DIAGNOSIS — M6281 Muscle weakness (generalized): Secondary | ICD-10-CM

## 2021-10-20 DIAGNOSIS — R279 Unspecified lack of coordination: Secondary | ICD-10-CM
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
## 2021-11-02 DIAGNOSIS — D849 Immunodeficiency, unspecified: Principal | ICD-10-CM

## 2021-11-02 DIAGNOSIS — Z940 Kidney transplant status: Principal | ICD-10-CM

## 2021-11-03 ENCOUNTER — Ambulatory Visit: Payer: Managed Care, Other (non HMO) | Admitting: Obstetrics and Gynecology

## 2021-11-04 ENCOUNTER — Other Ambulatory Visit: Payer: Self-pay

## 2021-11-04 ENCOUNTER — Ambulatory Visit: Payer: Managed Care, Other (non HMO) | Attending: Obstetrics and Gynecology | Admitting: Physical Therapy

## 2021-11-04 DIAGNOSIS — M6281 Muscle weakness (generalized): Secondary | ICD-10-CM | POA: Insufficient documentation

## 2021-11-04 DIAGNOSIS — R269 Unspecified abnormalities of gait and mobility: Secondary | ICD-10-CM | POA: Insufficient documentation

## 2021-11-04 DIAGNOSIS — R279 Unspecified lack of coordination: Secondary | ICD-10-CM | POA: Diagnosis present
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
## 2021-11-11 ENCOUNTER — Encounter: Payer: Self-pay | Admitting: Obstetrics and Gynecology

## 2021-11-11 ENCOUNTER — Ambulatory Visit (INDEPENDENT_AMBULATORY_CARE_PROVIDER_SITE_OTHER): Payer: Managed Care, Other (non HMO) | Admitting: Obstetrics and Gynecology

## 2021-11-11 ENCOUNTER — Ambulatory Visit: Payer: Managed Care, Other (non HMO) | Admitting: Physical Therapy

## 2021-11-11 ENCOUNTER — Other Ambulatory Visit: Payer: Self-pay

## 2021-11-11 ENCOUNTER — Encounter: Payer: Self-pay | Admitting: Physical Therapy

## 2021-11-11 VITALS — BP 110/75 | HR 76

## 2021-11-11 DIAGNOSIS — M6281 Muscle weakness (generalized): Secondary | ICD-10-CM

## 2021-11-11 DIAGNOSIS — R279 Unspecified lack of coordination: Secondary | ICD-10-CM | POA: Diagnosis not present

## 2021-11-11 DIAGNOSIS — R3915 Urgency of urination: Secondary | ICD-10-CM

## 2021-11-11 DIAGNOSIS — N8110 Cystocele, unspecified: Secondary | ICD-10-CM

## 2021-11-11 DIAGNOSIS — R159 Full incontinence of feces: Secondary | ICD-10-CM | POA: Diagnosis not present
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
## 2021-11-16 DIAGNOSIS — D849 Immunodeficiency, unspecified: Principal | ICD-10-CM

## 2021-11-16 DIAGNOSIS — Z940 Kidney transplant status: Principal | ICD-10-CM

## 2021-11-23 DIAGNOSIS — Z940 Kidney transplant status: Principal | ICD-10-CM

## 2021-11-23 DIAGNOSIS — Z79899 Other long term (current) drug therapy: Principal | ICD-10-CM

## 2021-11-27 ENCOUNTER — Ambulatory Visit: Admit: 2021-11-27 | Discharge: 2021-11-28 | Payer: PRIVATE HEALTH INSURANCE

## 2021-11-27 ENCOUNTER — Ambulatory Visit
Admit: 2021-11-27 | Discharge: 2021-11-28 | Payer: PRIVATE HEALTH INSURANCE | Attending: Nephrology | Primary: Nephrology

## 2021-11-27 DIAGNOSIS — I10 Essential (primary) hypertension: Principal | ICD-10-CM

## 2021-11-27 DIAGNOSIS — Z940 Kidney transplant status: Principal | ICD-10-CM

## 2021-11-27 DIAGNOSIS — Z79899 Other long term (current) drug therapy: Principal | ICD-10-CM

## 2021-11-27 MED ORDER — MYFORTIC 180 MG TABLET,DELAYED RELEASE
ORAL_TABLET | Freq: Two times a day (BID) | ORAL | 11 refills | 30.00000 days | Status: CP
Start: 2021-11-27 — End: 2022-11-27

## 2021-11-30 ENCOUNTER — Other Ambulatory Visit: Payer: Self-pay

## 2021-11-30 ENCOUNTER — Ambulatory Visit: Payer: Managed Care, Other (non HMO) | Admitting: Physical Therapy

## 2021-11-30 ENCOUNTER — Encounter (HOSPITAL_BASED_OUTPATIENT_CLINIC_OR_DEPARTMENT_OTHER): Payer: Self-pay | Admitting: Obstetrics & Gynecology

## 2021-11-30 DIAGNOSIS — D849 Immunodeficiency, unspecified: Principal | ICD-10-CM

## 2021-11-30 DIAGNOSIS — Z940 Kidney transplant status: Principal | ICD-10-CM

## 2021-11-30 DIAGNOSIS — M6281 Muscle weakness (generalized): Secondary | ICD-10-CM

## 2021-11-30 DIAGNOSIS — R279 Unspecified lack of coordination: Secondary | ICD-10-CM | POA: Diagnosis not present

## 2021-11-30 DIAGNOSIS — R269 Unspecified abnormalities of gait and mobility: Secondary | ICD-10-CM
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
## 2021-12-07 ENCOUNTER — Ambulatory Visit: Payer: Managed Care, Other (non HMO) | Attending: Obstetrics and Gynecology | Admitting: Physical Therapy

## 2021-12-07 ENCOUNTER — Other Ambulatory Visit: Payer: Self-pay

## 2021-12-07 ENCOUNTER — Encounter: Payer: Self-pay | Admitting: Physical Therapy

## 2021-12-07 DIAGNOSIS — Z940 Kidney transplant status: Principal | ICD-10-CM

## 2021-12-07 DIAGNOSIS — R279 Unspecified lack of coordination: Secondary | ICD-10-CM | POA: Insufficient documentation

## 2021-12-07 DIAGNOSIS — R293 Abnormal posture: Secondary | ICD-10-CM | POA: Insufficient documentation

## 2021-12-07 DIAGNOSIS — M6281 Muscle weakness (generalized): Secondary | ICD-10-CM | POA: Insufficient documentation

## 2021-12-07 DIAGNOSIS — R278 Other lack of coordination: Secondary | ICD-10-CM | POA: Diagnosis present
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
## 2021-12-14 ENCOUNTER — Ambulatory Visit: Payer: Managed Care, Other (non HMO) | Admitting: Physical Therapy

## 2021-12-14 DIAGNOSIS — Z940 Kidney transplant status: Principal | ICD-10-CM

## 2021-12-14 DIAGNOSIS — D849 Immunodeficiency, unspecified: Principal | ICD-10-CM

## 2021-12-17 DIAGNOSIS — G629 Polyneuropathy, unspecified: Principal | ICD-10-CM

## 2021-12-21 ENCOUNTER — Other Ambulatory Visit: Payer: Self-pay

## 2021-12-21 ENCOUNTER — Ambulatory Visit: Payer: Managed Care, Other (non HMO) | Admitting: Physical Therapy

## 2021-12-21 DIAGNOSIS — R278 Other lack of coordination: Secondary | ICD-10-CM

## 2021-12-21 DIAGNOSIS — M6281 Muscle weakness (generalized): Secondary | ICD-10-CM | POA: Diagnosis not present
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
## 2021-12-22 ENCOUNTER — Encounter: Payer: Self-pay | Admitting: Neurology

## 2021-12-22 NOTE — Telephone Encounter (Signed)
Patient last seen 06/2021. She has been scheduled for a follow up on 12/29/21.

## 2021-12-23 ENCOUNTER — Other Ambulatory Visit (HOSPITAL_BASED_OUTPATIENT_CLINIC_OR_DEPARTMENT_OTHER): Payer: Self-pay | Admitting: *Deleted

## 2021-12-23 ENCOUNTER — Encounter (HOSPITAL_BASED_OUTPATIENT_CLINIC_OR_DEPARTMENT_OTHER): Payer: Self-pay | Admitting: Obstetrics & Gynecology

## 2021-12-23 DIAGNOSIS — Z940 Kidney transplant status: Principal | ICD-10-CM

## 2021-12-23 MED ORDER — ESTRADIOL 0.1 MG/GM VA CREA: TOPICAL_CREAM | VAGINAL | 4 refills | Status: DC

## 2021-12-23 MED ORDER — POTASSIUM CHLORIDE ER 10 MEQ TABLET,EXTENDED RELEASE(PART/CRYST)
ORAL_TABLET | Freq: Every day | ORAL | 3 refills | 90 days | Status: CP
Start: 2021-12-23 — End: 2022-12-23

## 2021-12-23 MED ORDER — GABAPENTIN 100 MG CAPSULE
ORAL_CAPSULE | Freq: Two times a day (BID) | ORAL | 3 refills | 90 days | Status: CP
Start: 2021-12-23 — End: 2022-12-23

## 2021-12-23 MED ORDER — HYDROCHLOROTHIAZIDE 25 MG TABLET
ORAL_TABLET | Freq: Every day | ORAL | 3 refills | 90.00000 days | Status: CP
Start: 2021-12-23 — End: 2022-12-23

## 2021-12-28 ENCOUNTER — Encounter (HOSPITAL_BASED_OUTPATIENT_CLINIC_OR_DEPARTMENT_OTHER): Payer: Self-pay | Admitting: Obstetrics & Gynecology

## 2021-12-28 ENCOUNTER — Encounter: Payer: Managed Care, Other (non HMO) | Admitting: Physical Therapy

## 2021-12-28 DIAGNOSIS — D849 Immunodeficiency, unspecified: Principal | ICD-10-CM

## 2021-12-28 DIAGNOSIS — Z940 Kidney transplant status: Principal | ICD-10-CM

## 2021-12-29 ENCOUNTER — Encounter: Payer: Self-pay | Admitting: Neurology

## 2021-12-29 ENCOUNTER — Ambulatory Visit (INDEPENDENT_AMBULATORY_CARE_PROVIDER_SITE_OTHER): Payer: Managed Care, Other (non HMO) | Admitting: Neurology

## 2021-12-29 VITALS — BP 135/81 | HR 68 | Ht 67.0 in | Wt 172.5 lb

## 2021-12-29 DIAGNOSIS — G63 Polyneuropathy in diseases classified elsewhere: Secondary | ICD-10-CM

## 2021-12-29 DIAGNOSIS — Q612 Polycystic kidney, adult type: Secondary | ICD-10-CM | POA: Diagnosis not present

## 2021-12-29 DIAGNOSIS — D849 Immunodeficiency, unspecified: Secondary | ICD-10-CM

## 2021-12-29 DIAGNOSIS — Z940 Kidney transplant status: Secondary | ICD-10-CM | POA: Insufficient documentation

## 2021-12-29 DIAGNOSIS — R202 Paresthesia of skin: Secondary | ICD-10-CM

## 2021-12-29 MED ORDER — DULOXETINE HCL 30 MG PO CPEP: 30.0000 mg | ORAL_CAPSULE | Freq: Every day | ORAL | 11 refills | Status: DC
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
## 2021-12-30 ENCOUNTER — Encounter (HOSPITAL_BASED_OUTPATIENT_CLINIC_OR_DEPARTMENT_OTHER): Payer: Self-pay | Admitting: Obstetrics & Gynecology

## 2021-12-30 ENCOUNTER — Ambulatory Visit (INDEPENDENT_AMBULATORY_CARE_PROVIDER_SITE_OTHER): Payer: Managed Care, Other (non HMO) | Admitting: Obstetrics & Gynecology

## 2021-12-30 ENCOUNTER — Encounter: Payer: Self-pay | Admitting: Physical Therapy

## 2021-12-30 ENCOUNTER — Ambulatory Visit: Payer: Managed Care, Other (non HMO) | Attending: Obstetrics and Gynecology | Admitting: Physical Therapy

## 2021-12-30 ENCOUNTER — Other Ambulatory Visit: Payer: Self-pay

## 2021-12-30 VITALS — BP 140/74 | HR 64 | Ht 67.0 in | Wt 172.0 lb

## 2021-12-30 DIAGNOSIS — R269 Unspecified abnormalities of gait and mobility: Secondary | ICD-10-CM | POA: Diagnosis present

## 2021-12-30 DIAGNOSIS — M6281 Muscle weakness (generalized): Secondary | ICD-10-CM | POA: Diagnosis present

## 2021-12-30 DIAGNOSIS — N63 Unspecified lump in unspecified breast: Secondary | ICD-10-CM | POA: Diagnosis not present

## 2021-12-30 DIAGNOSIS — R293 Abnormal posture: Secondary | ICD-10-CM | POA: Insufficient documentation

## 2021-12-30 DIAGNOSIS — R278 Other lack of coordination: Secondary | ICD-10-CM | POA: Diagnosis present

## 2021-12-30 DIAGNOSIS — N3281 Overactive bladder: Secondary | ICD-10-CM | POA: Diagnosis not present
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
## 2022-01-01 LAB — COMPREHENSIVE METABOLIC PANEL
ALT: 19 IU/L (ref 0–32)
AST: 20 IU/L (ref 0–40)
Albumin/Globulin Ratio: 1.8 (ref 1.2–2.2)
Albumin: 4.3 g/dL (ref 3.8–4.8)
Alkaline Phosphatase: 72 IU/L (ref 44–121)
BUN/Creatinine Ratio: 25 (ref 12–28)
BUN: 28 mg/dL — ABNORMAL HIGH (ref 8–27)
Bilirubin Total: 0.4 mg/dL (ref 0.0–1.2)
CO2: 26 mmol/L (ref 20–29)
Calcium: 10.1 mg/dL (ref 8.7–10.3)
Chloride: 99 mmol/L (ref 96–106)
Creatinine, Ser: 1.13 mg/dL — ABNORMAL HIGH (ref 0.57–1.00)
Globulin, Total: 2.4 g/dL (ref 1.5–4.5)
Glucose: 99 mg/dL (ref 70–99)
Potassium: 3.5 mmol/L (ref 3.5–5.2)
Sodium: 139 mmol/L (ref 134–144)
Total Protein: 6.7 g/dL (ref 6.0–8.5)
eGFR: 54 mL/min/{1.73_m2} — ABNORMAL LOW (ref >59)

## 2022-01-01 LAB — COPPER, SERUM: Copper: 109 ug/dL (ref 80–158)

## 2022-01-01 LAB — CBC WITH DIFFERENTIAL/PLATELET
Basophils Absolute: 0 10*3/uL (ref 0.0–0.2)
Basos: 1 %
EOS (ABSOLUTE): 0.2 10*3/uL (ref 0.0–0.4)
Eos: 3 %
Hematocrit: 40.8 % (ref 34.0–46.6)
Hemoglobin: 13.3 g/dL (ref 11.1–15.9)
Immature Grans (Abs): 0 10*3/uL (ref 0.0–0.1)
Immature Granulocytes: 0 %
Lymphocytes Absolute: 1 10*3/uL (ref 0.7–3.1)
Lymphs: 18 %
MCH: 27.5 pg (ref 26.6–33.0)
MCHC: 32.6 g/dL (ref 31.5–35.7)
MCV: 85 fL (ref 79–97)
Monocytes Absolute: 0.6 10*3/uL (ref 0.1–0.9)
Monocytes: 11 %
Neutrophils Absolute: 3.5 10*3/uL (ref 1.4–7.0)
Neutrophils: 67 %
Platelets: 206 10*3/uL (ref 150–450)
RBC: 4.83 x10E6/uL (ref 3.77–5.28)
RDW: 13.3 % (ref 11.7–15.4)
WBC: 5.3 10*3/uL (ref 3.4–10.8)

## 2022-01-01 LAB — LIPID PANEL
Chol/HDL Ratio: 3 ratio (ref 0.0–4.4)
Cholesterol, Total: 146 mg/dL (ref 100–199)
HDL: 48 mg/dL (ref >39)
LDL Chol Calc (NIH): 73 mg/dL (ref 0–99)
Triglycerides: 141 mg/dL (ref 0–149)
VLDL Cholesterol Cal: 25 mg/dL (ref 5–40)

## 2022-01-01 LAB — SIROLIMUS LEVEL: Sirolimus (Rapamycin): 2.8 ng/mL — ABNORMAL LOW (ref 3.0–20.0)

## 2022-01-01 LAB — MAGNESIUM: Magnesium: 1.9 mg/dL (ref 1.6–2.3)

## 2022-01-01 LAB — HGB A1C W/O EAG: Hgb A1c MFr Bld: 5.6 % (ref 4.8–5.6)

## 2022-01-01 LAB — FERRITIN: Ferritin: 47 ng/mL (ref 15–150)

## 2022-01-01 LAB — PHOSPHORUS: Phosphorus: 3.2 mg/dL (ref 3.0–4.3)

## 2022-01-01 LAB — TSH: TSH: 1.6 u[IU]/mL (ref 0.450–4.500)

## 2022-01-01 LAB — VITAMIN B12: Vitamin B-12: 452 pg/mL (ref 232–1245)
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
## 2022-01-04 ENCOUNTER — Encounter: Payer: Managed Care, Other (non HMO) | Admitting: Physical Therapy

## 2022-01-05 ENCOUNTER — Encounter: Payer: Self-pay | Admitting: Neurology

## 2022-01-08 ENCOUNTER — Encounter (HOSPITAL_BASED_OUTPATIENT_CLINIC_OR_DEPARTMENT_OTHER): Payer: Self-pay | Admitting: Obstetrics & Gynecology

## 2022-01-11 ENCOUNTER — Ambulatory Visit: Payer: Managed Care, Other (non HMO) | Admitting: Physical Therapy

## 2022-01-11 ENCOUNTER — Encounter: Payer: Self-pay | Admitting: Physical Therapy

## 2022-01-11 ENCOUNTER — Other Ambulatory Visit: Payer: Self-pay

## 2022-01-11 ENCOUNTER — Encounter (HOSPITAL_BASED_OUTPATIENT_CLINIC_OR_DEPARTMENT_OTHER): Payer: Self-pay | Admitting: Obstetrics & Gynecology

## 2022-01-11 DIAGNOSIS — Z940 Kidney transplant status: Principal | ICD-10-CM

## 2022-01-11 DIAGNOSIS — D849 Immunodeficiency, unspecified: Principal | ICD-10-CM

## 2022-01-11 DIAGNOSIS — R278 Other lack of coordination: Secondary | ICD-10-CM

## 2022-01-11 DIAGNOSIS — M6281 Muscle weakness (generalized): Secondary | ICD-10-CM

## 2022-01-11 DIAGNOSIS — R269 Unspecified abnormalities of gait and mobility: Secondary | ICD-10-CM | POA: Diagnosis not present
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
## 2022-01-18 ENCOUNTER — Ambulatory Visit: Payer: Managed Care, Other (non HMO) | Admitting: Physical Therapy

## 2022-01-18 ENCOUNTER — Other Ambulatory Visit: Payer: Self-pay

## 2022-01-18 DIAGNOSIS — R278 Other lack of coordination: Secondary | ICD-10-CM

## 2022-01-18 DIAGNOSIS — R269 Unspecified abnormalities of gait and mobility: Secondary | ICD-10-CM | POA: Diagnosis not present

## 2022-01-18 DIAGNOSIS — R293 Abnormal posture: Secondary | ICD-10-CM
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
## 2022-01-21 ENCOUNTER — Other Ambulatory Visit: Payer: Self-pay | Admitting: Neurology

## 2022-01-23 ENCOUNTER — Encounter (HOSPITAL_BASED_OUTPATIENT_CLINIC_OR_DEPARTMENT_OTHER): Payer: Self-pay | Admitting: Obstetrics & Gynecology

## 2022-01-25 ENCOUNTER — Other Ambulatory Visit: Payer: Self-pay

## 2022-01-25 ENCOUNTER — Ambulatory Visit: Payer: Managed Care, Other (non HMO) | Admitting: Physical Therapy

## 2022-01-25 DIAGNOSIS — Z940 Kidney transplant status: Principal | ICD-10-CM

## 2022-01-25 DIAGNOSIS — D849 Immunodeficiency, unspecified: Principal | ICD-10-CM

## 2022-01-25 DIAGNOSIS — R269 Unspecified abnormalities of gait and mobility: Secondary | ICD-10-CM

## 2022-01-25 DIAGNOSIS — R278 Other lack of coordination: Secondary | ICD-10-CM

## 2022-01-25 DIAGNOSIS — M6281 Muscle weakness (generalized): Secondary | ICD-10-CM
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
## 2022-01-26 MED ORDER — AMLODIPINE 5 MG TABLET
ORAL_TABLET | 1 refills | 0 days
Start: 2022-01-26 — End: ?

## 2022-01-28 ENCOUNTER — Ambulatory Visit (INDEPENDENT_AMBULATORY_CARE_PROVIDER_SITE_OTHER): Payer: Managed Care, Other (non HMO) | Admitting: Obstetrics & Gynecology

## 2022-01-28 ENCOUNTER — Encounter (HOSPITAL_BASED_OUTPATIENT_CLINIC_OR_DEPARTMENT_OTHER): Payer: Self-pay | Admitting: Obstetrics & Gynecology

## 2022-01-28 VITALS — BP 139/73 | HR 69 | Ht 66.5 in | Wt 169.8 lb

## 2022-01-28 DIAGNOSIS — Z01419 Encounter for gynecological examination (general) (routine) without abnormal findings: Secondary | ICD-10-CM | POA: Diagnosis not present

## 2022-01-28 DIAGNOSIS — N3946 Mixed incontinence: Secondary | ICD-10-CM | POA: Diagnosis not present

## 2022-01-28 DIAGNOSIS — N812 Incomplete uterovaginal prolapse: Secondary | ICD-10-CM

## 2022-01-28 DIAGNOSIS — Z960 Presence of urogenital implants: Secondary | ICD-10-CM

## 2022-01-28 DIAGNOSIS — I6350 Cerebral infarction due to unspecified occlusion or stenosis of unspecified cerebral artery: Secondary | ICD-10-CM

## 2022-01-28 DIAGNOSIS — D849 Immunodeficiency, unspecified: Secondary | ICD-10-CM

## 2022-01-28 DIAGNOSIS — Z940 Kidney transplant status: Secondary | ICD-10-CM

## 2022-01-28 MED ORDER — TRIAMCINOLONE ACETONIDE 0.5 % EX OINT: 1.0000 "application " | TOPICAL_OINTMENT | Freq: Two times a day (BID) | CUTANEOUS | 0 refills | Status: DC
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
## 2022-01-31 DIAGNOSIS — N3946 Mixed incontinence: Secondary | ICD-10-CM | POA: Insufficient documentation

## 2022-02-01 ENCOUNTER — Ambulatory Visit: Payer: Managed Care, Other (non HMO) | Attending: Obstetrics and Gynecology | Admitting: Physical Therapy

## 2022-02-01 DIAGNOSIS — M6281 Muscle weakness (generalized): Secondary | ICD-10-CM | POA: Diagnosis present

## 2022-02-01 DIAGNOSIS — R269 Unspecified abnormalities of gait and mobility: Secondary | ICD-10-CM | POA: Diagnosis present
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
## 2022-02-01 NOTE — Patient Instructions (Signed)

## 2022-02-08 DIAGNOSIS — D849 Immunodeficiency, unspecified: Principal | ICD-10-CM

## 2022-02-08 DIAGNOSIS — Z940 Kidney transplant status: Principal | ICD-10-CM

## 2022-02-22 DIAGNOSIS — D849 Immunodeficiency, unspecified: Principal | ICD-10-CM

## 2022-02-22 DIAGNOSIS — Z940 Kidney transplant status: Principal | ICD-10-CM

## 2022-02-23 ENCOUNTER — Ambulatory Visit: Payer: Managed Care, Other (non HMO) | Admitting: Obstetrics and Gynecology

## 2022-03-08 DIAGNOSIS — D849 Immunodeficiency, unspecified: Principal | ICD-10-CM

## 2022-03-08 DIAGNOSIS — Z940 Kidney transplant status: Principal | ICD-10-CM

## 2022-03-19 DIAGNOSIS — Z940 Kidney transplant status: Principal | ICD-10-CM

## 2022-03-19 MED ORDER — SIROLIMUS 1 MG TABLET
ORAL_TABLET | 11 refills | 0 days | Status: CP
Start: 2022-03-19 — End: ?

## 2022-03-22 DIAGNOSIS — Z940 Kidney transplant status: Principal | ICD-10-CM

## 2022-03-22 DIAGNOSIS — D849 Immunodeficiency, unspecified: Principal | ICD-10-CM

## 2022-03-24 ENCOUNTER — Encounter: Payer: Self-pay | Admitting: Obstetrics and Gynecology

## 2022-03-24 ENCOUNTER — Ambulatory Visit (INDEPENDENT_AMBULATORY_CARE_PROVIDER_SITE_OTHER): Payer: Managed Care, Other (non HMO) | Admitting: Obstetrics and Gynecology

## 2022-03-24 VITALS — BP 178/97 | HR 79

## 2022-03-24 DIAGNOSIS — N3941 Urge incontinence: Secondary | ICD-10-CM
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
## 2022-04-05 ENCOUNTER — Encounter (HOSPITAL_BASED_OUTPATIENT_CLINIC_OR_DEPARTMENT_OTHER): Payer: Self-pay | Admitting: Obstetrics & Gynecology

## 2022-04-05 ENCOUNTER — Encounter: Payer: Self-pay | Admitting: Physical Therapy

## 2022-04-05 DIAGNOSIS — D849 Immunodeficiency, unspecified: Principal | ICD-10-CM

## 2022-04-05 DIAGNOSIS — Z940 Kidney transplant status: Principal | ICD-10-CM

## 2022-04-16 ENCOUNTER — Ambulatory Visit: Admit: 2022-04-16 | Discharge: 2022-04-17 | Payer: PRIVATE HEALTH INSURANCE

## 2022-04-16 MED ORDER — EZETIMIBE 10 MG TABLET
ORAL_TABLET | Freq: Every day | ORAL | 3 refills | 90 days | Status: CP
Start: 2022-04-16 — End: 2023-04-16

## 2022-04-16 MED ORDER — POTASSIUM CHLORIDE ER 20 MEQ TABLET,EXTENDED RELEASE(PART/CRYST)
ORAL_TABLET | Freq: Every day | ORAL | 3 refills | 90 days | Status: CP
Start: 2022-04-16 — End: 2023-04-16

## 2022-04-19 DIAGNOSIS — Z940 Kidney transplant status: Principal | ICD-10-CM

## 2022-04-19 DIAGNOSIS — D849 Immunodeficiency, unspecified: Principal | ICD-10-CM

## 2022-04-20 DIAGNOSIS — Z940 Kidney transplant status: Principal | ICD-10-CM

## 2022-05-17 DIAGNOSIS — Z940 Kidney transplant status: Principal | ICD-10-CM

## 2022-05-25 ENCOUNTER — Encounter (HOSPITAL_BASED_OUTPATIENT_CLINIC_OR_DEPARTMENT_OTHER): Payer: Self-pay | Admitting: Obstetrics & Gynecology

## 2022-06-14 DIAGNOSIS — Z940 Kidney transplant status: Principal | ICD-10-CM

## 2022-06-14 MED ORDER — OMEGA-3 ACID ETHYL ESTERS 1 GRAM CAPSULE
ORAL_CAPSULE | 3 refills | 0 days | Status: CP
Start: 2022-06-14 — End: ?

## 2022-07-07 ENCOUNTER — Encounter (HOSPITAL_BASED_OUTPATIENT_CLINIC_OR_DEPARTMENT_OTHER): Payer: Self-pay | Admitting: Obstetrics & Gynecology

## 2022-07-08 DIAGNOSIS — Z940 Kidney transplant status: Principal | ICD-10-CM

## 2022-07-08 MED ORDER — CARVEDILOL 6.25 MG TABLET
ORAL_TABLET | Freq: Two times a day (BID) | ORAL | 3 refills | 90 days | Status: CP
Start: 2022-07-08 — End: 2023-07-09

## 2022-07-12 DIAGNOSIS — Z940 Kidney transplant status: Principal | ICD-10-CM

## 2022-08-09 DIAGNOSIS — Z940 Kidney transplant status: Principal | ICD-10-CM

## 2022-08-23 ENCOUNTER — Encounter: Payer: Self-pay | Admitting: *Deleted

## 2022-08-26 ENCOUNTER — Encounter (HOSPITAL_BASED_OUTPATIENT_CLINIC_OR_DEPARTMENT_OTHER): Payer: Self-pay | Admitting: Obstetrics & Gynecology

## 2022-08-26 DIAGNOSIS — E7800 Pure hypercholesterolemia, unspecified: Principal | ICD-10-CM

## 2022-08-26 MED ORDER — ATORVASTATIN 10 MG TABLET
ORAL_TABLET | Freq: Every day | ORAL | 3 refills | 90 days | Status: CP
Start: 2022-08-26 — End: ?

## 2022-09-06 DIAGNOSIS — Z940 Kidney transplant status: Principal | ICD-10-CM

## 2022-09-10 DIAGNOSIS — Z940 Kidney transplant status: Principal | ICD-10-CM

## 2022-09-10 MED ORDER — MYFORTIC 180 MG TABLET,DELAYED RELEASE
ORAL_TABLET | 11 refills | 0 days | Status: CP
Start: 2022-09-10 — End: ?

## 2022-09-13 DIAGNOSIS — Z940 Kidney transplant status: Principal | ICD-10-CM

## 2022-09-13 DIAGNOSIS — D849 Immunodeficiency, unspecified: Principal | ICD-10-CM

## 2022-09-16 DIAGNOSIS — Z940 Kidney transplant status: Principal | ICD-10-CM

## 2022-09-16 MED ORDER — MYCOPHENOLATE SODIUM 180 MG TABLET,DELAYED RELEASE
ORAL_TABLET | ORAL | 3 refills | 90 days | Status: CP
Start: 2022-09-16 — End: 2023-09-16

## 2022-09-27 MED ORDER — GABAPENTIN 100 MG CAPSULE
ORAL_CAPSULE | 3 refills | 0 days
Start: 2022-09-27 — End: ?

## 2022-09-28 MED ORDER — GABAPENTIN 100 MG CAPSULE
ORAL_CAPSULE | Freq: Every evening | ORAL | 3 refills | 90 days | Status: CP
Start: 2022-09-28 — End: 2023-09-29

## 2022-09-29 DIAGNOSIS — Z940 Kidney transplant status: Principal | ICD-10-CM

## 2022-09-29 MED ORDER — MYFORTIC 180 MG TABLET,DELAYED RELEASE
ORAL_TABLET | 11 refills | 0 days | Status: CP
Start: 2022-09-29 — End: ?

## 2022-10-01 ENCOUNTER — Ambulatory Visit (INDEPENDENT_AMBULATORY_CARE_PROVIDER_SITE_OTHER): Payer: Managed Care, Other (non HMO) | Admitting: Physician Assistant

## 2022-10-01 ENCOUNTER — Ambulatory Visit (INDEPENDENT_AMBULATORY_CARE_PROVIDER_SITE_OTHER): Payer: Managed Care, Other (non HMO)

## 2022-10-01 ENCOUNTER — Encounter: Payer: Self-pay | Admitting: Physician Assistant

## 2022-10-01 DIAGNOSIS — M25522 Pain in left elbow: Secondary | ICD-10-CM | POA: Diagnosis not present

## 2022-10-01 DIAGNOSIS — G8929 Other chronic pain: Secondary | ICD-10-CM

## 2022-10-01 DIAGNOSIS — M25561 Pain in right knee: Secondary | ICD-10-CM | POA: Diagnosis not present

## 2022-10-01 MED ORDER — BUPIVACAINE HCL 0.25 % IJ SOLN
2.0000 mL | INTRAMUSCULAR | Status: AC | PRN
  Administered 2022-10-01: 2 mL via INTRA_ARTICULAR

## 2022-10-01 MED ORDER — LIDOCAINE HCL 1 % IJ SOLN
2.0000 mL | INTRAMUSCULAR | Status: AC | PRN
  Administered 2022-10-01: 2 mL

## 2022-10-01 MED ORDER — METHYLPREDNISOLONE ACETATE 40 MG/ML IJ SUSP
80.0000 mg | INTRAMUSCULAR | Status: AC | PRN
  Administered 2022-10-01: 80 mg via INTRA_ARTICULAR
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
## 2022-10-04 DIAGNOSIS — Z940 Kidney transplant status: Principal | ICD-10-CM

## 2022-10-14 ENCOUNTER — Ambulatory Visit (INDEPENDENT_AMBULATORY_CARE_PROVIDER_SITE_OTHER): Payer: Managed Care, Other (non HMO) | Admitting: Orthopaedic Surgery

## 2022-10-14 ENCOUNTER — Encounter: Payer: Self-pay | Admitting: Orthopaedic Surgery

## 2022-10-14 DIAGNOSIS — M25561 Pain in right knee: Secondary | ICD-10-CM

## 2022-10-14 DIAGNOSIS — M25522 Pain in left elbow: Secondary | ICD-10-CM
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
## 2022-10-29 ENCOUNTER — Encounter (HOSPITAL_BASED_OUTPATIENT_CLINIC_OR_DEPARTMENT_OTHER): Payer: Self-pay | Admitting: Obstetrics & Gynecology

## 2022-11-01 DIAGNOSIS — Z940 Kidney transplant status: Principal | ICD-10-CM

## 2022-11-12 ENCOUNTER — Encounter (HOSPITAL_BASED_OUTPATIENT_CLINIC_OR_DEPARTMENT_OTHER): Payer: Self-pay | Admitting: *Deleted

## 2022-11-16 ENCOUNTER — Other Ambulatory Visit (HOSPITAL_COMMUNITY): Payer: Self-pay | Admitting: Gastroenterology

## 2022-11-16 DIAGNOSIS — R1011 Right upper quadrant pain: Secondary | ICD-10-CM

## 2022-11-17 ENCOUNTER — Other Ambulatory Visit (HOSPITAL_COMMUNITY): Payer: Self-pay | Admitting: Gastroenterology

## 2022-11-17 DIAGNOSIS — R1011 Right upper quadrant pain: Secondary | ICD-10-CM

## 2022-11-18 ENCOUNTER — Ambulatory Visit (HOSPITAL_COMMUNITY)
Admission: RE | Admit: 2022-11-18 | Discharge: 2022-11-18 | Disposition: A | Discharge status: Home / Self Care | Payer: Managed Care, Other (non HMO) | Source: Ambulatory Visit | Attending: Gastroenterology | Admitting: Gastroenterology

## 2022-11-18 DIAGNOSIS — R1011 Right upper quadrant pain: Secondary | ICD-10-CM | POA: Diagnosis present

## 2022-11-22 ENCOUNTER — Encounter (HOSPITAL_COMMUNITY): Payer: Managed Care, Other (non HMO)

## 2022-11-23 ENCOUNTER — Encounter (HOSPITAL_COMMUNITY)
Admission: RE | Admit: 2022-11-23 | Discharge: 2022-11-23 | Disposition: A | Discharge status: Home / Self Care | Payer: Managed Care, Other (non HMO) | Source: Ambulatory Visit | Attending: Gastroenterology | Admitting: Gastroenterology

## 2022-11-23 DIAGNOSIS — R1011 Right upper quadrant pain: Secondary | ICD-10-CM | POA: Diagnosis not present

## 2022-11-23 MED ORDER — TECHNETIUM TC 99M MEBROFENIN IV KIT
5.0000 | PACK | Freq: Once | INTRAVENOUS | Status: AC | PRN
  Administered 2022-11-23: 5 via INTRAVENOUS

## 2022-11-29 DIAGNOSIS — Z940 Kidney transplant status: Principal | ICD-10-CM

## 2022-11-30 ENCOUNTER — Encounter (HOSPITAL_BASED_OUTPATIENT_CLINIC_OR_DEPARTMENT_OTHER): Payer: Self-pay | Admitting: Obstetrics & Gynecology

## 2022-12-03 DIAGNOSIS — N186 End stage renal disease: Principal | ICD-10-CM

## 2022-12-03 DIAGNOSIS — E119 Type 2 diabetes mellitus without complications: Principal | ICD-10-CM

## 2022-12-03 DIAGNOSIS — Z79899 Other long term (current) drug therapy: Principal | ICD-10-CM

## 2022-12-03 DIAGNOSIS — Z940 Kidney transplant status: Principal | ICD-10-CM

## 2022-12-09 ENCOUNTER — Ambulatory Visit: Admit: 2022-12-09 | Discharge: 2022-12-09 | Payer: PRIVATE HEALTH INSURANCE

## 2022-12-09 ENCOUNTER — Ambulatory Visit
Admit: 2022-12-09 | Discharge: 2022-12-09 | Payer: PRIVATE HEALTH INSURANCE | Attending: Nephrology | Primary: Nephrology

## 2022-12-09 DIAGNOSIS — Z940 Kidney transplant status: Principal | ICD-10-CM

## 2022-12-09 DIAGNOSIS — N186 End stage renal disease: Principal | ICD-10-CM

## 2022-12-09 DIAGNOSIS — Z79899 Other long term (current) drug therapy: Principal | ICD-10-CM

## 2022-12-09 DIAGNOSIS — E119 Type 2 diabetes mellitus without complications: Principal | ICD-10-CM

## 2022-12-10 DIAGNOSIS — Z940 Kidney transplant status: Principal | ICD-10-CM

## 2022-12-10 MED ORDER — ALENDRONATE 70 MG TABLET
ORAL_TABLET | ORAL | 3 refills | 84 days | Status: CP
Start: 2022-12-10 — End: 2023-12-10

## 2022-12-10 MED ORDER — CALCIUM CARBONATE 200 MG CALCIUM (500 MG) CHEWABLE TABLET
ORAL_TABLET | Freq: Two times a day (BID) | ORAL | 3 refills | 90 days | Status: CP
Start: 2022-12-10 — End: 2023-12-10

## 2022-12-13 DIAGNOSIS — Z940 Kidney transplant status: Principal | ICD-10-CM

## 2022-12-27 DIAGNOSIS — Z940 Kidney transplant status: Principal | ICD-10-CM

## 2022-12-30 ENCOUNTER — Encounter (HOSPITAL_BASED_OUTPATIENT_CLINIC_OR_DEPARTMENT_OTHER): Payer: Self-pay | Admitting: Obstetrics & Gynecology

## 2023-01-10 DIAGNOSIS — Z940 Kidney transplant status: Principal | ICD-10-CM

## 2023-01-18 DIAGNOSIS — Z940 Kidney transplant status: Principal | ICD-10-CM

## 2023-01-18 MED ORDER — HYDROCHLOROTHIAZIDE 25 MG TABLET
ORAL_TABLET | Freq: Every day | ORAL | 3 refills | 90 days | Status: CP
Start: 2023-01-18 — End: 2024-01-18

## 2023-01-24 DIAGNOSIS — Z940 Kidney transplant status: Principal | ICD-10-CM

## 2023-01-27 ENCOUNTER — Other Ambulatory Visit: Payer: Self-pay | Admitting: Neurology

## 2023-02-06 DIAGNOSIS — Z940 Kidney transplant status: Principal | ICD-10-CM

## 2023-02-08 ENCOUNTER — Other Ambulatory Visit: Payer: Self-pay | Admitting: Neurology

## 2023-02-11 ENCOUNTER — Encounter (HOSPITAL_BASED_OUTPATIENT_CLINIC_OR_DEPARTMENT_OTHER): Payer: Self-pay | Admitting: Obstetrics & Gynecology

## 2023-02-12 IMAGING — US US PELVIS COMPLETE WITH TRANSVAGINAL
1 series · 13 of 25 positions shown · non-contrast
Comparison: 02/14/2020

CLINICAL DATA: Pelvic pain, history of renal transplant

EXAM:
TRANSABDOMINAL AND TRANSVAGINAL ULTRASOUND OF PELVIS
TECHNIQUE: Both transabdominal and transvaginal ultrasound examinations of the
pelvis were performed. Transabdominal technique was performed for
global imaging of the pelvis including uterus, ovaries, adnexal
regions, and pelvic cul-de-sac. It was necessary to proceed with
endovaginal exam following the transabdominal exam to visualize the
endometrium and ovaries.

[Series 1: us pelvic complete with transvaginal · 13 of 65 slices shown]
[im 1/65]
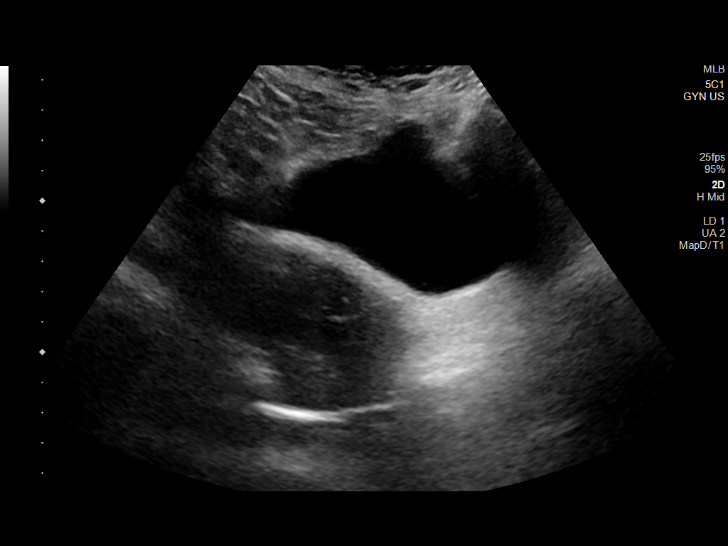
[im 6/65]
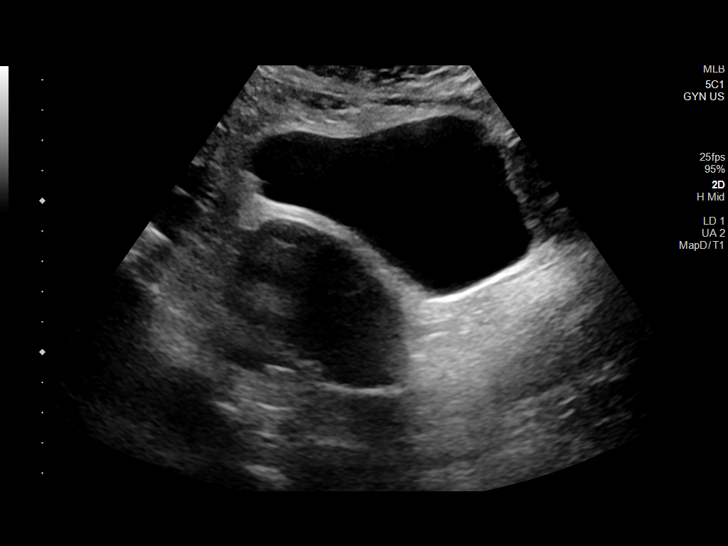
[im 11/65]
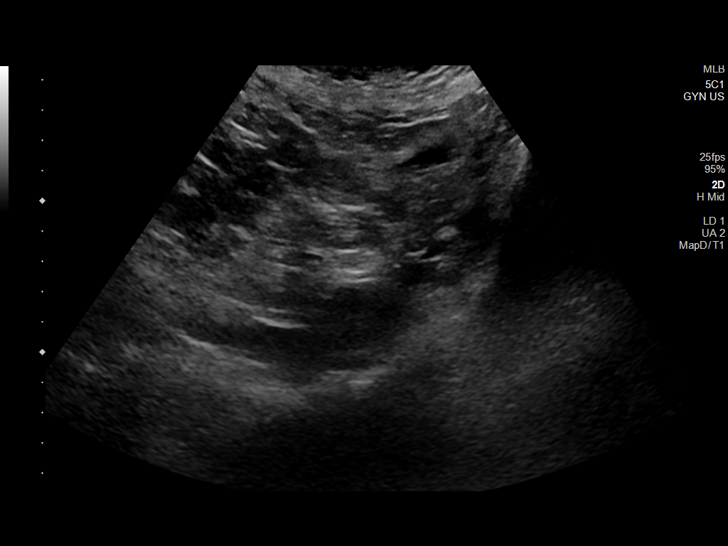
[im 17/65]
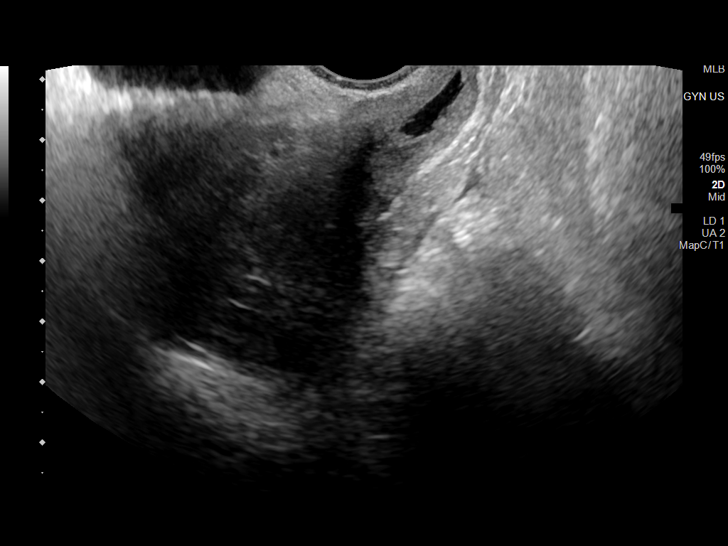
[im 22/65]
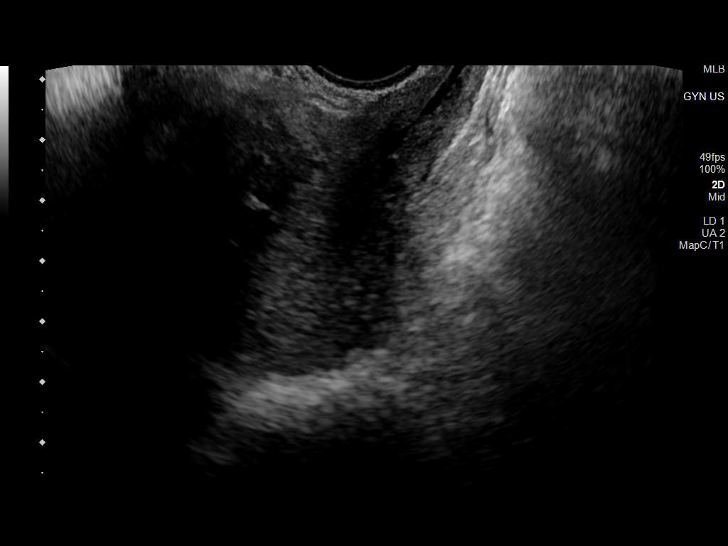
[im 27/65]
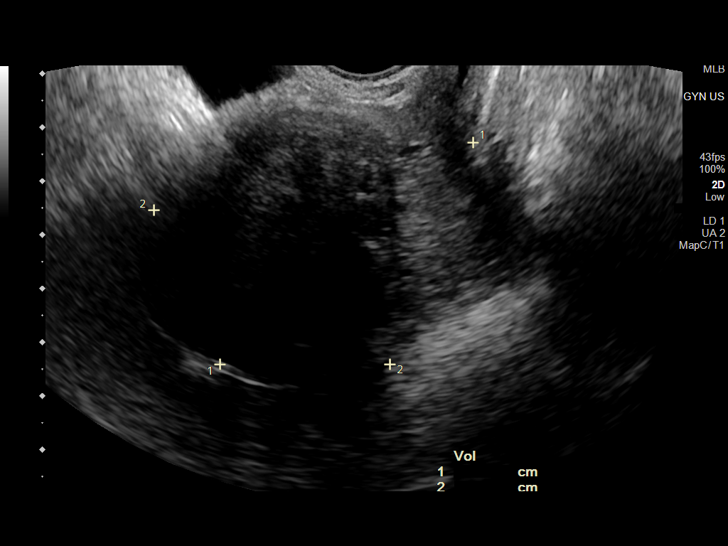
[im 33/65]
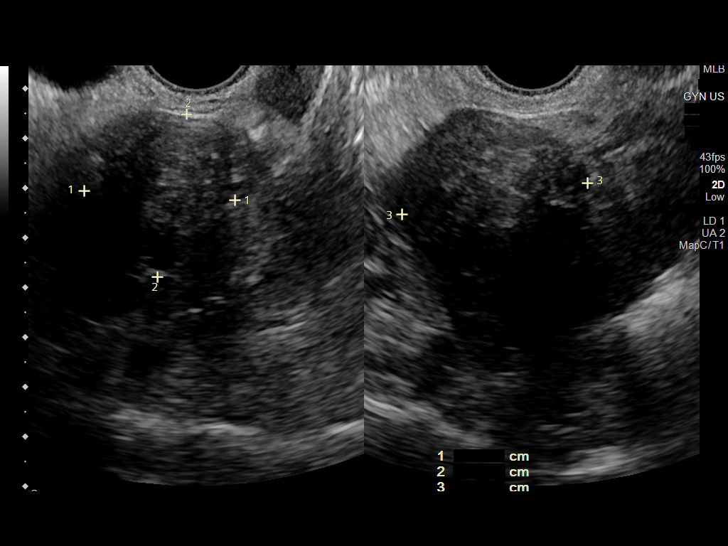
[im 38/65]
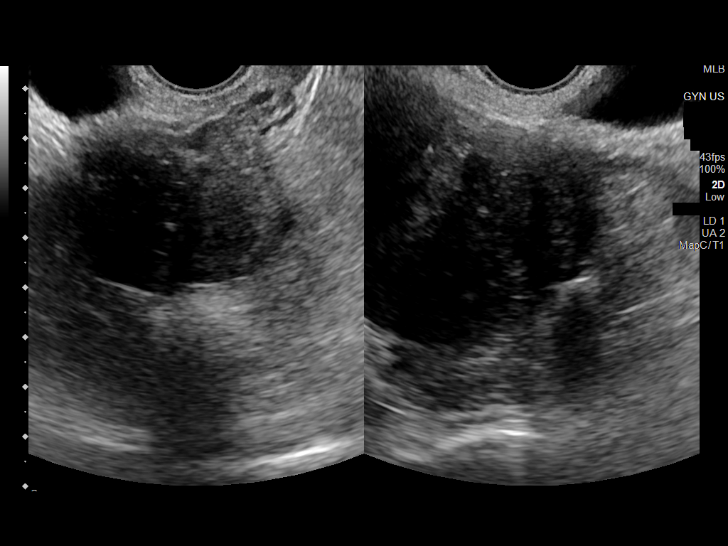
[im 43/65]
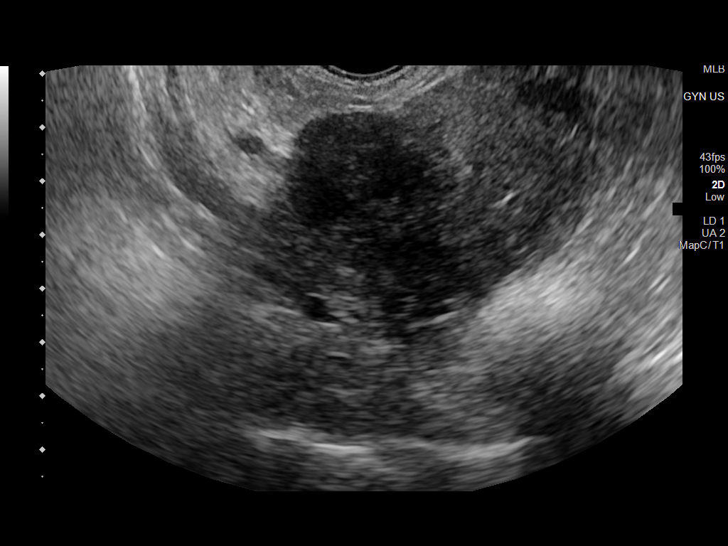
[im 49/65]
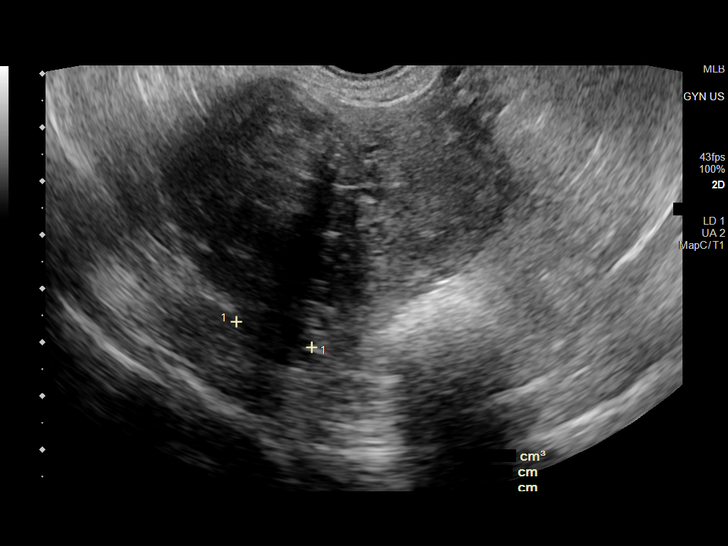
[im 54/65]
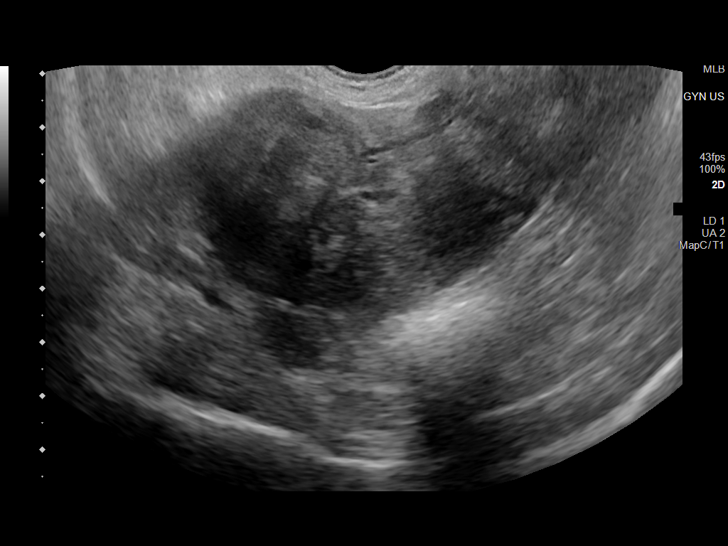
[im 59/65]
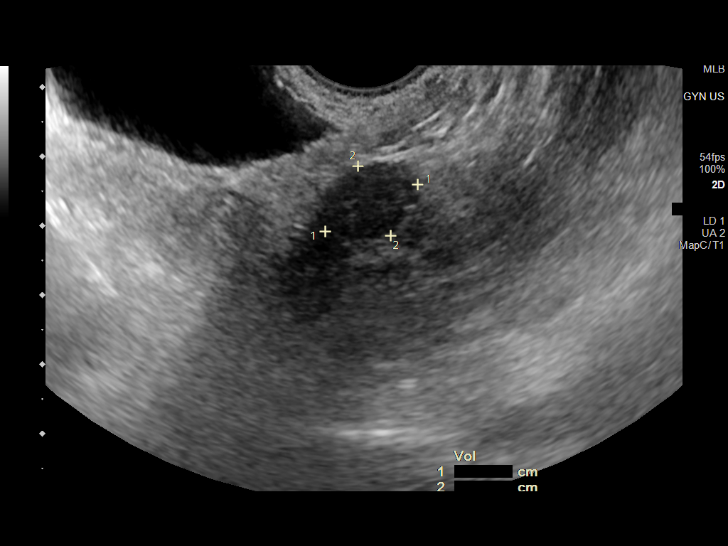
[im 65/65]
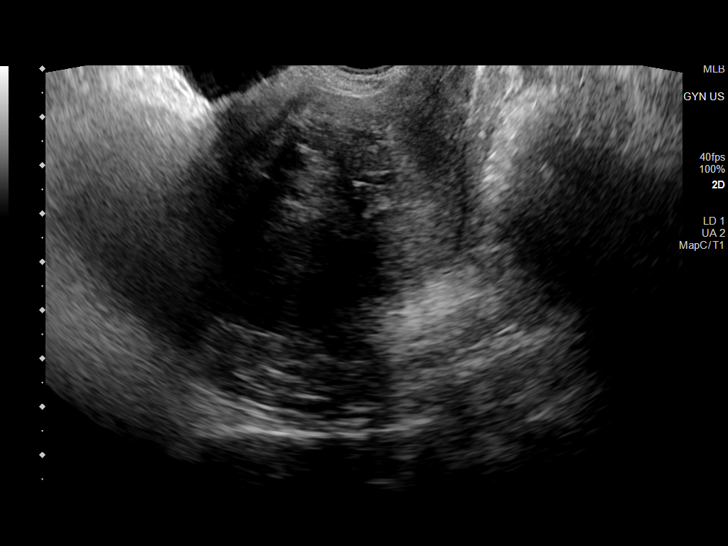

[13 of 25 positions shown; findings below may reference images not displayed]

FINDINGS: Uterus

Measurements: 6.3 x 5.3 x 6.2 cm = volume: 1 of 6 mL. Anteverted.
Scattered areas of shadowing. Anterior wall leiomyoma subserosal
x 3.3 x 3.8 cm. Posterior upper uterine leiomyoma, intramural, 4.1 x
2.8 x 3.3 cm. Additional small anterior LEFT leiomyoma 2.7 x 3.0 x
2.2 cm.

Endometrium

Thickness: Obscured due to shadowing hand leiomyomata. No
endometrial fluid identified

Right ovary

Measurements: 3.6 x 1.0 x 1.5 cm = volume: 2.0 mL. Poorly visualized
due to shadowing from adjacent uterus.

Left ovary

Measurements: 1.5 x 1.1 x 2.0 cm = volume: 1.7 mL. Normal morphology
without mass

Other findings

No free pelvic fluid.  No adnexal masses.
IMPRESSION: Multiple uterine leiomyomata as above.

Nonvisualization of endometrial complex.

Unremarkable ovaries and adnexa.

## 2023-02-21 DIAGNOSIS — Z940 Kidney transplant status: Principal | ICD-10-CM

## 2023-02-22 ENCOUNTER — Telehealth: Payer: Self-pay | Admitting: Neurology

## 2023-02-22 MED ORDER — DULOXETINE HCL 30 MG PO CPEP: ORAL_CAPSULE | ORAL | 3 refills | Status: DC

## 2023-02-22 MED ORDER — DULOXETINE HCL 30 MG PO CPEP: ORAL_CAPSULE | ORAL | 30 refills | Status: DC

## 2023-02-22 NOTE — Telephone Encounter (Signed)
Pt has agreed to her 1 yr f/u and is on wait list.  Pt is asking if her DULoxetine (CYMBALTA) 30 MG capsule can be filled [facility] [phone]

## 2023-02-22 NOTE — Telephone Encounter (Signed)
NOTIFY PT REFILL SENT

## 2023-02-22 NOTE — Telephone Encounter (Signed)
Rx refilled. Patient will need to present for appointment for any further refills.

## 2023-03-21 DIAGNOSIS — Z940 Kidney transplant status: Principal | ICD-10-CM

## 2023-04-03 DIAGNOSIS — Z940 Kidney transplant status: Principal | ICD-10-CM

## 2023-04-04 DIAGNOSIS — Z940 Kidney transplant status: Principal | ICD-10-CM

## 2023-04-04 MED ORDER — MYFORTIC 180 MG TABLET,DELAYED RELEASE
ORAL_TABLET | Freq: Two times a day (BID) | ORAL | 11 refills | 30 days | Status: CP
Start: 2023-04-04 — End: 2024-04-03

## 2023-04-04 MED ORDER — SIROLIMUS 1 MG TABLET
ORAL_TABLET | Freq: Every day | ORAL | 11 refills | 30 days | Status: CP
Start: 2023-04-04 — End: 2024-04-03

## 2023-04-18 ENCOUNTER — Encounter: Payer: Self-pay | Admitting: Physician Assistant

## 2023-04-18 ENCOUNTER — Other Ambulatory Visit: Payer: Self-pay | Admitting: Physician Assistant

## 2023-04-18 ENCOUNTER — Ambulatory Visit (INDEPENDENT_AMBULATORY_CARE_PROVIDER_SITE_OTHER): Payer: Managed Care, Other (non HMO) | Admitting: Physician Assistant

## 2023-04-18 ENCOUNTER — Other Ambulatory Visit (INDEPENDENT_AMBULATORY_CARE_PROVIDER_SITE_OTHER): Payer: Managed Care, Other (non HMO)

## 2023-04-18 DIAGNOSIS — Z940 Kidney transplant status: Principal | ICD-10-CM

## 2023-04-18 DIAGNOSIS — M25571 Pain in right ankle and joints of right foot: Secondary | ICD-10-CM

## 2023-04-18 DIAGNOSIS — G8929 Other chronic pain: Secondary | ICD-10-CM

## 2023-04-18 DIAGNOSIS — M79672 Pain in left foot: Secondary | ICD-10-CM

## 2023-04-18 DIAGNOSIS — M25561 Pain in right knee: Secondary | ICD-10-CM

## 2023-04-18 MED ORDER — BUPIVACAINE HCL 0.25 % IJ SOLN
2.0000 mL | INTRAMUSCULAR | Status: AC | PRN
  Administered 2023-04-18: 2 mL via INTRA_ARTICULAR

## 2023-04-18 MED ORDER — METHYLPREDNISOLONE ACETATE 40 MG/ML IJ SUSP
80.0000 mg | INTRAMUSCULAR | Status: AC | PRN
  Administered 2023-04-18: 80 mg via INTRA_ARTICULAR

## 2023-04-18 MED ORDER — LIDOCAINE HCL 1 % IJ SOLN
2.0000 mL | INTRAMUSCULAR | Status: AC | PRN
  Administered 2023-04-18: 2 mL
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
## 2023-04-20 ENCOUNTER — Telehealth: Payer: Self-pay

## 2023-04-20 NOTE — Telephone Encounter (Signed)
-----   Message from Samuella Cota, [place] sent at 04/18/2023  3:35 PM EDT ----- MAP said to try to get the Gel injection approved for pt. thanks

## 2023-04-20 NOTE — Telephone Encounter (Signed)
VOB submitted for Durolane, right knee.  Faxed completed PA form to [employer] at [phone] for Durolane, right knee. PA pending

## 2023-04-21 DIAGNOSIS — Z940 Kidney transplant status: Principal | ICD-10-CM

## 2023-05-03 ENCOUNTER — Encounter: Payer: Self-pay | Admitting: Neurology

## 2023-05-03 ENCOUNTER — Ambulatory Visit (INDEPENDENT_AMBULATORY_CARE_PROVIDER_SITE_OTHER): Payer: Managed Care, Other (non HMO) | Admitting: Neurology

## 2023-05-03 VITALS — BP 116/60 | HR 74 | Ht 66.0 in | Wt 165.0 lb

## 2023-05-03 DIAGNOSIS — G63 Polyneuropathy in diseases classified elsewhere: Secondary | ICD-10-CM

## 2023-05-03 DIAGNOSIS — G4730 Sleep apnea, unspecified: Secondary | ICD-10-CM | POA: Diagnosis not present

## 2023-05-03 DIAGNOSIS — G4733 Obstructive sleep apnea (adult) (pediatric): Secondary | ICD-10-CM | POA: Insufficient documentation

## 2023-05-03 DIAGNOSIS — R0683 Snoring: Secondary | ICD-10-CM | POA: Diagnosis not present

## 2023-05-03 MED ORDER — DULOXETINE HCL 30 MG PO CPEP: ORAL_CAPSULE | ORAL | 3 refills | Status: DC

## 2023-05-03 NOTE — Patient Instructions (Signed)
Great to meet you, I will refill your Cymbalta, placed a referral for official sleep consultation.  Thanks!!
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
## 2023-05-06 ENCOUNTER — Encounter: Payer: Self-pay | Admitting: Neurology

## 2023-05-06 MED ORDER — POTASSIUM CHLORIDE ER 20 MEQ TABLET,EXTENDED RELEASE(PART/CRYST)
ORAL_TABLET | Freq: Every day | ORAL | 3 refills | 90 days | Status: CP
Start: 2023-05-06 — End: 2024-05-05

## 2023-05-12 ENCOUNTER — Telehealth: Payer: Self-pay

## 2023-05-12 DIAGNOSIS — M25561 Pain in right knee: Secondary | ICD-10-CM

## 2023-05-12 NOTE — Telephone Encounter (Signed)
Called and left a VM for patient to CB to schedule for gel injection with Mary Anne.  See referrals tab.  

## 2023-05-16 DIAGNOSIS — Z940 Kidney transplant status: Principal | ICD-10-CM

## 2023-06-03 ENCOUNTER — Encounter (HOSPITAL_BASED_OUTPATIENT_CLINIC_OR_DEPARTMENT_OTHER): Payer: Self-pay | Admitting: Obstetrics & Gynecology

## 2023-06-06 ENCOUNTER — Other Ambulatory Visit (HOSPITAL_BASED_OUTPATIENT_CLINIC_OR_DEPARTMENT_OTHER): Payer: Self-pay | Admitting: *Deleted

## 2023-06-06 DIAGNOSIS — Z940 Kidney transplant status: Principal | ICD-10-CM

## 2023-06-06 MED ORDER — MIRABEGRON ER 50 MG PO TB24: 50.0000 mg | ORAL_TABLET | Freq: Every day | ORAL | 0 refills | Status: DC

## 2023-06-07 MED ORDER — OMEGA-3 ACID ETHYL ESTERS 1 GRAM CAPSULE
ORAL_CAPSULE | 3 refills | 0 days
Start: 2023-06-07 — End: ?

## 2023-06-08 MED ORDER — OMEGA-3 ACID ETHYL ESTERS 1 GRAM CAPSULE
ORAL_CAPSULE | 3 refills | 0 days | Status: CP
Start: 2023-06-08 — End: ?

## 2023-06-13 DIAGNOSIS — Z940 Kidney transplant status: Principal | ICD-10-CM

## 2023-06-16 ENCOUNTER — Ambulatory Visit
Admit: 2023-06-16 | Discharge: 2023-06-17 | Payer: PRIVATE HEALTH INSURANCE | Attending: Nephrology | Primary: Nephrology

## 2023-06-16 DIAGNOSIS — D84821 Immunosuppression due to drug therapy (CMS-HCC): Principal | ICD-10-CM

## 2023-06-16 DIAGNOSIS — G44019 Episodic cluster headache, not intractable: Principal | ICD-10-CM

## 2023-06-16 DIAGNOSIS — Z79899 Other long term (current) drug therapy: Principal | ICD-10-CM

## 2023-06-16 DIAGNOSIS — Z940 Kidney transplant status: Principal | ICD-10-CM

## 2023-06-16 DIAGNOSIS — I10 Essential (primary) hypertension: Principal | ICD-10-CM

## 2023-06-20 DIAGNOSIS — Z940 Kidney transplant status: Principal | ICD-10-CM

## 2023-06-21 ENCOUNTER — Encounter: Payer: Self-pay | Admitting: Neurology

## 2023-06-21 ENCOUNTER — Ambulatory Visit (INDEPENDENT_AMBULATORY_CARE_PROVIDER_SITE_OTHER): Payer: Managed Care, Other (non HMO) | Admitting: Neurology

## 2023-06-21 VITALS — BP 120/71 | HR 77 | Ht 66.0 in | Wt 166.0 lb

## 2023-06-21 DIAGNOSIS — R351 Nocturia: Secondary | ICD-10-CM | POA: Diagnosis not present

## 2023-06-21 DIAGNOSIS — G4700 Insomnia, unspecified: Secondary | ICD-10-CM

## 2023-06-21 DIAGNOSIS — G4733 Obstructive sleep apnea (adult) (pediatric): Secondary | ICD-10-CM | POA: Diagnosis not present

## 2023-06-21 DIAGNOSIS — E663 Overweight: Secondary | ICD-10-CM | POA: Diagnosis not present

## 2023-06-21 DIAGNOSIS — R5383 Other fatigue: Secondary | ICD-10-CM
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
## 2023-06-21 NOTE — Patient Instructions (Signed)

## 2023-06-30 DIAGNOSIS — Z940 Kidney transplant status: Principal | ICD-10-CM

## 2023-07-05 ENCOUNTER — Ambulatory Visit (INDEPENDENT_AMBULATORY_CARE_PROVIDER_SITE_OTHER): Payer: Managed Care, Other (non HMO) | Admitting: Neurology

## 2023-07-05 DIAGNOSIS — G4733 Obstructive sleep apnea (adult) (pediatric): Secondary | ICD-10-CM | POA: Diagnosis not present

## 2023-07-05 DIAGNOSIS — G4700 Insomnia, unspecified: Secondary | ICD-10-CM

## 2023-07-05 DIAGNOSIS — E663 Overweight: Secondary | ICD-10-CM

## 2023-07-05 DIAGNOSIS — R5383 Other fatigue: Secondary | ICD-10-CM

## 2023-07-05 DIAGNOSIS — R351 Nocturia: Secondary | ICD-10-CM

## 2023-07-06 ENCOUNTER — Encounter: Payer: Self-pay | Admitting: Neurology

## 2023-07-06 DIAGNOSIS — E7800 Pure hypercholesterolemia, unspecified: Principal | ICD-10-CM

## 2023-07-06 MED ORDER — CARVEDILOL 6.25 MG TABLET
ORAL_TABLET | Freq: Two times a day (BID) | ORAL | 3 refills | 90 days | Status: CP
Start: 2023-07-06 — End: 2024-07-06

## 2023-07-06 MED ORDER — ATORVASTATIN 10 MG TABLET
ORAL | 3 refills | 90 days | Status: CP
Start: 2023-07-06 — End: 2024-07-05

## 2023-07-06 MED ORDER — EZETIMIBE 10 MG TABLET
ORAL_TABLET | Freq: Every day | ORAL | 3 refills | 90 days | Status: CP
Start: 2023-07-06 — End: 2024-07-05

## 2023-07-07 NOTE — Addendum Note (Signed)
Addended by: Huston Foley on: 07/07/2023 04:03 PM   Modules accepted: Orders
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
## 2023-07-08 ENCOUNTER — Ambulatory Visit: Admit: 2023-07-08 | Discharge: 2023-07-09 | Payer: PRIVATE HEALTH INSURANCE

## 2023-07-11 DIAGNOSIS — Z940 Kidney transplant status: Principal | ICD-10-CM

## 2023-07-11 NOTE — Telephone Encounter (Addendum)
I called pt. I advised pt that Dr. Frances Furbish reviewed their sleep study results and found that pt has sever OSA. Dr. Frances Furbish recommends that pt starts autopap. I reviewed PAP compliance expectations with the pt. Pt is agreeable to starting a CPAP. I advised pt that an order will be sent to a DME, [facility], and [facility] will call the pt within about one week after they file with the pt's insurance. [facility] will show the pt how to use the machine, fit for masks, and troubleshoot the CPAP if needed. A follow up appt was made for insurance purposes with Maralyn Sago 10/11/23 1245pm Pt verbalized understanding to arrive 15 minutes early and bring their CPAP. Pt verbalized understanding of results. Pt had no questions at this time but was encouraged to call back if questions arise. I have sent the order to [facility] and have received confirmation that they have received the order.    Bobbye Morton, CMA  Zott, Kennyth Arnold; Gamble, Tammy New orders have been placed for the above pt, DOB: 17-Apr-2056 Thanks  Zott, Stacy  Ayannah Faddis, Abbe Amsterdam, CMA; Melvern Sample Got It Thank You

## 2023-07-11 NOTE — Telephone Encounter (Signed)
Huston Foley, MD  P Gna-Pod 4 Results Patient referred by Margie Ege, NP, seen by me on 06/21/2023, patient had a HST on 07/06/2023.  Of note, the patient had worried about not having enough data on the home sleep test, the data was adequate for analysis, please advise her of this.  Please call and notify the patient that the recent home sleep test showed obstructive sleep apnea in the severe range. I recommend treatment for this in the form of autoPAP, which means, that we don't have to bring her in for a sleep study with CPAP, but will let her start using a so called autoPAP machine at home, through a DME company (of her choice, or as per insurance requirement). The DME representative will fit the patient with a mask of choice, educate her on how to use the machine, how to put the mask on, etc. I have placed an order in the chart. Please send the order to a local DME, talk to patient, send report to referring MD. Please also reinforce the need for compliance with treatment. We will need a FU in sleep clinic for 10 weeks post-PAP set up, please arrange that with me or one of our NPs. Thanks,

## 2023-07-26 DIAGNOSIS — Z940 Kidney transplant status: Principal | ICD-10-CM

## 2023-07-26 MED ORDER — SIROLIMUS 1 MG TABLET
ORAL_TABLET | Freq: Every day | ORAL | 11 refills | 15 days | Status: CP
Start: 2023-07-26 — End: 2024-07-25

## 2023-08-02 DIAGNOSIS — Z940 Kidney transplant status: Principal | ICD-10-CM

## 2023-08-02 MED ORDER — SIROLIMUS 1 MG TABLET
ORAL_TABLET | Freq: Every day | ORAL | 11 refills | 30 days | Status: CP
Start: 2023-08-02 — End: 2024-08-01

## 2023-08-04 MED ORDER — POTASSIUM CHLORIDE ER 10 MEQ TABLET,EXTENDED RELEASE(PART/CRYST)
ORAL_TABLET | Freq: Every day | ORAL | 3 refills | 90 days | Status: CP
Start: 2023-08-04 — End: 2024-08-03

## 2023-08-08 DIAGNOSIS — Z940 Kidney transplant status: Principal | ICD-10-CM

## 2023-08-25 DIAGNOSIS — E7800 Pure hypercholesterolemia, unspecified: Principal | ICD-10-CM

## 2023-08-25 MED ORDER — ATORVASTATIN 10 MG TABLET
ORAL_TABLET | ORAL | 3 refills | 90 days | Status: CP
Start: 2023-08-25 — End: ?

## 2023-08-29 ENCOUNTER — Encounter (HOSPITAL_BASED_OUTPATIENT_CLINIC_OR_DEPARTMENT_OTHER): Payer: Self-pay | Admitting: Obstetrics & Gynecology

## 2023-09-05 DIAGNOSIS — Z940 Kidney transplant status: Principal | ICD-10-CM

## 2023-09-09 ENCOUNTER — Ambulatory Visit: Payer: Managed Care, Other (non HMO) | Admitting: Physician Assistant

## 2023-09-12 DIAGNOSIS — Z940 Kidney transplant status: Principal | ICD-10-CM

## 2023-09-12 DIAGNOSIS — E119 Type 2 diabetes mellitus without complications: Principal | ICD-10-CM

## 2023-09-12 DIAGNOSIS — Z79899 Other long term (current) drug therapy: Principal | ICD-10-CM

## 2023-09-14 ENCOUNTER — Ambulatory Visit (INDEPENDENT_AMBULATORY_CARE_PROVIDER_SITE_OTHER): Payer: Managed Care, Other (non HMO) | Admitting: Physician Assistant

## 2023-09-14 DIAGNOSIS — M1711 Unilateral primary osteoarthritis, right knee: Secondary | ICD-10-CM

## 2023-09-14 MED ORDER — METHYLPREDNISOLONE ACETATE 40 MG/ML IJ SUSP
40.0000 mg | INTRAMUSCULAR | Status: AC | PRN
  Administered 2023-09-14: 40 mg via INTRA_ARTICULAR

## 2023-09-14 MED ORDER — LIDOCAINE HCL 1 % IJ SOLN
5.0000 mL | INTRAMUSCULAR | Status: AC | PRN
  Administered 2023-09-14: 5 mL
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
## 2023-09-15 ENCOUNTER — Ambulatory Visit
Admit: 2023-09-15 | Discharge: 2023-09-15 | Payer: PRIVATE HEALTH INSURANCE | Attending: Nephrology | Primary: Nephrology

## 2023-09-15 ENCOUNTER — Ambulatory Visit: Admit: 2023-09-15 | Discharge: 2023-09-15 | Payer: PRIVATE HEALTH INSURANCE

## 2023-09-15 DIAGNOSIS — Z940 Kidney transplant status: Principal | ICD-10-CM

## 2023-09-15 DIAGNOSIS — Z79899 Other long term (current) drug therapy: Principal | ICD-10-CM

## 2023-09-15 DIAGNOSIS — E119 Type 2 diabetes mellitus without complications: Principal | ICD-10-CM

## 2023-09-15 DIAGNOSIS — D849 Immunodeficiency, unspecified: Principal | ICD-10-CM

## 2023-09-19 ENCOUNTER — Encounter: Payer: Self-pay | Admitting: Neurology

## 2023-09-20 NOTE — Telephone Encounter (Signed)
Noted  

## 2023-09-20 NOTE — Telephone Encounter (Signed)
Reviewed CPAP download, 83% usage, greater than 4 hours 80%. 7-12 cm, leak 11.1, AHI 3.4.  Due to report of bloating, I will reduce pressure range down to 7-11 cm.

## 2023-09-20 NOTE — Telephone Encounter (Signed)
Spoke to patient states too mush air pressure coming from mask causing her to to be gassy during the day . Made pt aware that Dr. Frances Furbish is out today,will get a report printed to give to Sarah,NP to review .  Pt expressed understanding and thanked me for calling Spoke to [facility] Rep will tag machine so we are able  to see DL from resmed

## 2023-09-22 DIAGNOSIS — Z940 Kidney transplant status: Principal | ICD-10-CM

## 2023-09-23 DIAGNOSIS — Z940 Kidney transplant status: Principal | ICD-10-CM

## 2023-09-23 MED ORDER — SIROLIMUS 1 MG TABLET
ORAL_TABLET | Freq: Every day | ORAL | 3 refills | 90 days | Status: CP
Start: 2023-09-23 — End: 2024-09-22

## 2023-09-27 ENCOUNTER — Encounter: Payer: Self-pay | Admitting: Physician Assistant

## 2023-09-27 ENCOUNTER — Ambulatory Visit: Payer: Managed Care, Other (non HMO) | Admitting: Physician Assistant

## 2023-09-27 DIAGNOSIS — Z940 Kidney transplant status: Principal | ICD-10-CM

## 2023-09-27 DIAGNOSIS — M1711 Unilateral primary osteoarthritis, right knee: Secondary | ICD-10-CM

## 2023-09-27 MED ORDER — SODIUM HYALURONATE 60 MG/3ML IX PRSY
60.0000 mg | PREFILLED_SYRINGE | INTRA_ARTICULAR | Status: AC | PRN
  Administered 2023-09-27: 60 mg via INTRA_ARTICULAR
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
## 2023-10-01 DIAGNOSIS — Z940 Kidney transplant status: Principal | ICD-10-CM

## 2023-10-03 DIAGNOSIS — Z940 Kidney transplant status: Principal | ICD-10-CM

## 2023-10-03 MED ORDER — GABAPENTIN 100 MG CAPSULE
ORAL_CAPSULE | Freq: Every evening | ORAL | 3 refills | 90 days | Status: CP
Start: 2023-10-03 — End: 2024-10-03

## 2023-10-10 DIAGNOSIS — Z940 Kidney transplant status: Principal | ICD-10-CM
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
## 2023-10-11 ENCOUNTER — Encounter: Payer: Self-pay | Admitting: Neurology

## 2023-10-11 ENCOUNTER — Telehealth: Payer: Self-pay

## 2023-10-11 ENCOUNTER — Ambulatory Visit (INDEPENDENT_AMBULATORY_CARE_PROVIDER_SITE_OTHER): Payer: Managed Care, Other (non HMO) | Admitting: Neurology

## 2023-10-11 VITALS — BP 140/78 | HR 81 | Resp 15 | Ht 66.5 in

## 2023-10-11 DIAGNOSIS — Z940 Kidney transplant status: Principal | ICD-10-CM

## 2023-10-11 DIAGNOSIS — G4733 Obstructive sleep apnea (adult) (pediatric): Secondary | ICD-10-CM | POA: Diagnosis not present

## 2023-10-11 NOTE — Patient Instructions (Addendum)
Will change set pressure to 10 cm water, EPR 1  Follow up in 1 week, send my chart message

## 2023-10-11 NOTE — Telephone Encounter (Signed)
-----   Message from Glean Salvo sent at 10/11/2023  1:05 PM EST ----- Need to send order to DME to change from auto to setpressure 10 cm water, EPR 1

## 2023-10-11 NOTE — Telephone Encounter (Signed)
Community msg sent via epic for cpap order to [facility]

## 2023-10-19 ENCOUNTER — Telehealth: Payer: Self-pay | Admitting: Neurology

## 2023-10-19 NOTE — Telephone Encounter (Signed)
Called [facility] and they stated that they never received order for pressure change below:  Discussed with sleep lab manager, Meagan, will switch from auto PAP 7-11 cm to set pressure 10 cm, reduce EPR from 3 down from 1   New community msg sent thru epic to tammy gamble at [facility]

## 2023-10-19 NOTE — Telephone Encounter (Signed)
Please call DME.  I saw patient last week.  Plan is below.  I do not see where any changes were made to current settings on CPAP. Community message was sent to them. Thanks  3.  OSA on CPAP -Struggling with feeling bloated, belching, flatulence -Discussed with sleep lab manager, Meagan, will switch from auto PAP 7-11 cm to set pressure 10 cm, reduce EPR from 3 down from 1.  Pull a download in 1 week. -She has tried a variety of different mask, tape, chinstraps -She is motivated to continue usage, seeing dermatology tomorrow post Mohs surgery, sutures to nose -Had HST 07/06/2023 demonstrates severe obstructive sleep apnea - by number of events - with a total AHI of 46.8/hour and O2 nadir of 86%.  There was a borderline central apnea component as well.

## 2023-10-21 ENCOUNTER — Encounter: Payer: Self-pay | Admitting: Neurology

## 2023-10-31 ENCOUNTER — Encounter (HOSPITAL_BASED_OUTPATIENT_CLINIC_OR_DEPARTMENT_OTHER): Payer: Self-pay | Admitting: Obstetrics & Gynecology

## 2023-10-31 DIAGNOSIS — Z940 Kidney transplant status: Principal | ICD-10-CM

## 2023-11-03 ENCOUNTER — Encounter: Payer: Self-pay | Admitting: Neurology

## 2023-11-10 ENCOUNTER — Encounter (HOSPITAL_BASED_OUTPATIENT_CLINIC_OR_DEPARTMENT_OTHER): Payer: Self-pay

## 2023-11-17 DIAGNOSIS — Z940 Kidney transplant status: Principal | ICD-10-CM

## 2023-11-18 ENCOUNTER — Other Ambulatory Visit (HOSPITAL_BASED_OUTPATIENT_CLINIC_OR_DEPARTMENT_OTHER): Payer: Self-pay | Admitting: *Deleted

## 2023-11-18 DIAGNOSIS — Z940 Kidney transplant status: Principal | ICD-10-CM

## 2023-11-18 MED ORDER — MIRABEGRON ER 50 MG PO TB24: 50.0000 mg | ORAL_TABLET | Freq: Every day | ORAL | 0 refills | Status: DC

## 2023-11-18 MED ORDER — MYCOPHENOLATE SODIUM 180 MG TABLET,DELAYED RELEASE
ORAL_TABLET | Freq: Two times a day (BID) | ORAL | 3 refills | 90.00 days | Status: CP
Start: 2023-11-18 — End: 2024-11-17

## 2023-11-18 NOTE — Progress Notes (Signed)
Pt requests refill on myrbetriq. Rx sent to pharmacy.

## 2023-11-22 DIAGNOSIS — Z940 Kidney transplant status: Principal | ICD-10-CM

## 2023-11-22 MED ORDER — MYCOPHENOLATE SODIUM 180 MG TABLET,DELAYED RELEASE
ORAL_TABLET | Freq: Two times a day (BID) | ORAL | 3 refills | 90.00 days | Status: CP
Start: 2023-11-22 — End: 2024-11-21

## 2023-11-28 DIAGNOSIS — Z940 Kidney transplant status: Principal | ICD-10-CM

## 2023-12-05 DIAGNOSIS — Z940 Kidney transplant status: Principal | ICD-10-CM

## 2023-12-21 DIAGNOSIS — Z79899 Other long term (current) drug therapy: Principal | ICD-10-CM

## 2023-12-21 DIAGNOSIS — Z940 Kidney transplant status: Principal | ICD-10-CM

## 2023-12-21 DIAGNOSIS — E119 Type 2 diabetes mellitus without complications: Principal | ICD-10-CM

## 2023-12-21 DIAGNOSIS — N184 Chronic kidney disease, stage 4 (severe): Principal | ICD-10-CM

## 2023-12-26 DIAGNOSIS — Z940 Kidney transplant status: Principal | ICD-10-CM

## 2023-12-29 ENCOUNTER — Inpatient Hospital Stay: Admit: 2023-12-29 | Discharge: 2023-12-29 | Payer: PRIVATE HEALTH INSURANCE

## 2023-12-29 ENCOUNTER — Ambulatory Visit: Admit: 2023-12-29 | Discharge: 2023-12-29 | Payer: PRIVATE HEALTH INSURANCE

## 2023-12-29 ENCOUNTER — Ambulatory Visit
Admit: 2023-12-29 | Discharge: 2023-12-29 | Payer: PRIVATE HEALTH INSURANCE | Attending: Nephrology | Primary: Nephrology

## 2023-12-29 DIAGNOSIS — Z940 Kidney transplant status: Principal | ICD-10-CM

## 2023-12-29 DIAGNOSIS — N184 Chronic kidney disease, stage 4 (severe): Principal | ICD-10-CM

## 2023-12-29 DIAGNOSIS — Z006 Encounter for examination for normal comparison and control in clinical research program: Principal | ICD-10-CM

## 2023-12-29 DIAGNOSIS — Z79899 Other long term (current) drug therapy: Principal | ICD-10-CM

## 2023-12-29 DIAGNOSIS — E119 Type 2 diabetes mellitus without complications: Principal | ICD-10-CM

## 2023-12-29 DIAGNOSIS — D849 Immunodeficiency, unspecified: Principal | ICD-10-CM

## 2023-12-30 DIAGNOSIS — Z940 Kidney transplant status: Principal | ICD-10-CM

## 2023-12-30 MED ORDER — HYDROCHLOROTHIAZIDE 25 MG TABLET
ORAL_TABLET | Freq: Every day | ORAL | 3 refills | 90.00 days | Status: CP
Start: 2023-12-30 — End: 2024-12-29

## 2024-01-02 DIAGNOSIS — Z940 Kidney transplant status: Principal | ICD-10-CM

## 2024-01-05 DIAGNOSIS — Z940 Kidney transplant status: Principal | ICD-10-CM

## 2024-01-23 DIAGNOSIS — Z940 Kidney transplant status: Principal | ICD-10-CM

## 2024-01-30 DIAGNOSIS — Z940 Kidney transplant status: Principal | ICD-10-CM

## 2024-02-13 DIAGNOSIS — Z940 Kidney transplant status: Principal | ICD-10-CM

## 2024-02-13 MED ORDER — SIROLIMUS 1 MG TABLET
ORAL_TABLET | Freq: Every day | ORAL | 3 refills | 90.00 days | Status: CP
Start: 2024-02-13 — End: 2025-02-12

## 2024-02-20 DIAGNOSIS — Z940 Kidney transplant status: Principal | ICD-10-CM

## 2024-03-01 DIAGNOSIS — N184 Chronic kidney disease, stage 4 (severe): Principal | ICD-10-CM

## 2024-03-01 DIAGNOSIS — Z940 Kidney transplant status: Principal | ICD-10-CM

## 2024-03-15 ENCOUNTER — Telehealth: Payer: Self-pay | Admitting: Neurology

## 2024-03-15 NOTE — Telephone Encounter (Signed)
 LVM and sent mychart msg informing pt of need to reschedule 04/25/24 appt - NP out

## 2024-03-15 NOTE — Telephone Encounter (Signed)
 Pt has called and r/s the my chart vv

## 2024-03-19 DIAGNOSIS — Z940 Kidney transplant status: Principal | ICD-10-CM

## 2024-03-21 DIAGNOSIS — Z940 Kidney transplant status: Principal | ICD-10-CM

## 2024-04-03 ENCOUNTER — Encounter (HOSPITAL_COMMUNITY): Payer: Self-pay

## 2024-04-03 ENCOUNTER — Emergency Department (HOSPITAL_COMMUNITY)

## 2024-04-03 ENCOUNTER — Emergency Department (HOSPITAL_COMMUNITY)
Admission: EM | Admit: 2024-04-03 | Discharge: 2024-04-04 | Disposition: A | Discharge status: Home / Self Care | Attending: Emergency Medicine | Admitting: Emergency Medicine

## 2024-04-03 DIAGNOSIS — R140 Abdominal distension (gaseous): Secondary | ICD-10-CM | POA: Diagnosis not present

## 2024-04-03 DIAGNOSIS — R630 Anorexia: Secondary | ICD-10-CM | POA: Insufficient documentation

## 2024-04-03 DIAGNOSIS — R1084 Generalized abdominal pain: Secondary | ICD-10-CM | POA: Diagnosis not present

## 2024-04-03 DIAGNOSIS — Z7982 Long term (current) use of aspirin: Secondary | ICD-10-CM | POA: Diagnosis not present

## 2024-04-03 DIAGNOSIS — R109 Unspecified abdominal pain: Secondary | ICD-10-CM | POA: Diagnosis present

## 2024-04-03 DIAGNOSIS — K5900 Constipation, unspecified: Secondary | ICD-10-CM

## 2024-04-03 DIAGNOSIS — E876 Hypokalemia: Secondary | ICD-10-CM | POA: Insufficient documentation

## 2024-04-03 DIAGNOSIS — K5289 Other specified noninfective gastroenteritis and colitis: Secondary | ICD-10-CM

## 2024-04-03 LAB — COMPREHENSIVE METABOLIC PANEL WITH GFR
ALT: 18 U/L (ref 0–44)
AST: 20 U/L (ref 15–41)
Albumin: 3.4 g/dL — ABNORMAL LOW (ref 3.5–5.0)
Alkaline Phosphatase: 57 U/L (ref 38–126)
Anion gap: 13 (ref 5–15)
BUN: 17 mg/dL (ref 8–23)
CO2: 26 mmol/L (ref 22–32)
Calcium: 9.7 mg/dL (ref 8.9–10.3)
Chloride: 90 mmol/L — ABNORMAL LOW (ref 98–111)
Creatinine, Ser: 0.9 mg/dL (ref 0.44–1.00)
GFR, Estimated: >60 mL/min (ref >60)
Glucose, Bld: 150 mg/dL — ABNORMAL HIGH (ref 70–99)
Potassium: 2.8 mmol/L — ABNORMAL LOW (ref 3.5–5.1)
Sodium: 129 mmol/L — ABNORMAL LOW (ref 135–145)
Total Bilirubin: 1.3 mg/dL — ABNORMAL HIGH (ref 0.0–1.2)
Total Protein: 6.8 g/dL (ref 6.5–8.1)

## 2024-04-03 LAB — CBC
HCT: 43.5 % (ref 36.0–46.0)
Hemoglobin: 14.6 g/dL (ref 12.0–15.0)
MCH: 26.9 pg (ref 26.0–34.0)
MCHC: 33.6 g/dL (ref 30.0–36.0)
MCV: 80.3 fL (ref 80.0–100.0)
Platelets: 258 10*3/uL (ref 150–400)
RBC: 5.42 MIL/uL — ABNORMAL HIGH (ref 3.87–5.11)
RDW: 15.3 % (ref 11.5–15.5)
WBC: 11 10*3/uL — ABNORMAL HIGH (ref 4.0–10.5)
nRBC: 0 % (ref 0.0–0.2)

## 2024-04-03 LAB — LIPASE, BLOOD: Lipase: 22 U/L (ref 11–51)

## 2024-04-03 MED ORDER — METOCLOPRAMIDE HCL 5 MG/ML IJ SOLN
10.0000 mg | Freq: Once | INTRAMUSCULAR | Status: AC
Start: 2024-04-03 — End: 2024-04-03
  Administered 2024-04-03: 10 mg via INTRAMUSCULAR
  Filled 2024-04-03: qty 2

## 2024-04-03 MED ORDER — IOHEXOL 300 MG/ML  SOLN
100.0000 mL | Freq: Once | INTRAMUSCULAR | Status: DC | PRN
Start: 2024-04-03 — End: 2024-04-03

## 2024-04-03 MED ORDER — IOHEXOL 9 MG/ML PO SOLN
ORAL | Status: AC
  Filled 2024-04-03: qty 500

## 2024-04-03 MED ORDER — IOHEXOL 9 MG/ML PO SOLN: 500.0000 mL | ORAL | Status: AC

## 2024-04-03 MED ORDER — IOHEXOL 9 MG/ML PO SOLN: 1000.0000 mL | ORAL | Status: DC

## 2024-04-03 MED ORDER — HYDROMORPHONE HCL 1 MG/ML IJ SOLN
1.0000 mg | Freq: Once | INTRAMUSCULAR | Status: AC
  Administered 2024-04-03: 1 mg via INTRAMUSCULAR
  Filled 2024-04-03: qty 1

## 2024-04-03 MED ORDER — POTASSIUM CHLORIDE IN NACL 20-0.9 MEQ/L-% IV SOLN
Freq: Once | INTRAVENOUS | Status: AC
  Filled 2024-04-03: qty 1000

## 2024-04-03 NOTE — ED Provider Notes (Signed)
 [place] EMERGENCY DEPARTMENT AT [facility] Provider Note   CSN: [id number] Arrival date & time: 04/03/24  1826     History  Chief Complaint  Patient presents with   Abdominal Pain   Constipation    Linda Gilmore is a 68 y.o. female.  HPI Patient presents with abdominal pain, lack of bowel movements.  Patient has a history of prior diverticulitis, constipation, is working with a gastroenterologist.  She notes that with diminished bowel movements over the past week she was tried enema, MiraLAX, stool softener and yesterday started Linzess.  Today with worsening abdominal pain she presents for evaluation.  No vomiting, there is anorexia.    Home Medications Prior to Admission medications   Medication Sig Start Date End Date Taking? Authorizing Provider  aspirin EC 81 MG tablet Take 81 mg by mouth. 11/14/15   [provider]  atorvastatin (LIPITOR) 10 MG tablet Take 10 mg by mouth every evening.     [provider]  Biotin 10 MG CAPS Take by mouth.    [provider]  carvedilol (COREG) 6.25 MG tablet Take 6.25 mg by mouth 2 (two) times daily. 03/20/21   [provider]  Cholecalciferol (VITAMIN D3) 5000 units TABS Take 1 tablet by mouth daily.    [provider]  co-enzyme Q-10 30 MG capsule Take 30 mg by mouth 3 (three) times daily.    [provider]  diclofenac  Sodium (VOLTAREN ) 1 % GEL Apply 2 g topically 4 (four) times daily. She going to purchase OTC.    [provider]  DULoxetine  (CYMBALTA ) 30 MG capsule TAKE 1 CAPSULE BY MOUTH EVERY DAY 05/03/23   Wess Hammed, NP  estradiol  (ESTRACE  VAGINAL) 0.1 MG/GM vaginal cream 1 gram pv twice weekly 12/23/21   Lillian Rein, MD  ezetimibe (ZETIA) 10 MG tablet Take 1 tablet by mouth daily. 07/12/21   [provider]  famotidine (PEPCID) 40 MG tablet Take 40 mg by mouth 2 (two) times daily.    [provider]  FIBER ADULT GUMMIES PO Take by  mouth.    [provider]  gabapentin (NEURONTIN) 100 MG capsule Take 100 mg by mouth.  three caps QHS.    [provider]  Homeopathic Products (LEG CRAMP RELIEF PO) Take 2 tablets by mouth daily as needed. For leg cramps    [provider]  hydrochlorothiazide (HYDRODIURIL) 25 MG tablet Take 25 mg by mouth daily.    [provider]  hydrocortisone  (ANUSOL -HC) 2.5 % rectal cream Place rectally 2 (two) times daily. Do not use for more than 7 days. 08/14/21   Lillian Rein, MD  KLOR-CON M10 10 MEQ tablet Take 20 mEq by mouth daily. 07/02/21   [provider]  lidocaine  (XYLOCAINE ) 5 % ointment Apply topically up to three times daily for hemorrhoid pain.  Do not use for more then 5 days in a row. 08/14/21   Lillian Rein, MD  loratadine (CLARITIN) 10 MG tablet Take 10 mg by mouth daily as needed.    [provider]  methocarbamol  (ROBAXIN ) 500 MG tablet Take 1 tablet (500 mg total) by mouth 2 (two) times daily as needed for muscle spasms. 03/01/19   Petrarca, Brian D, PA-C  mirabegron  ER (MYRBETRIQ ) 50 MG TB24 tablet Take 1 tablet (50 mg total) by mouth daily. 11/18/23   Lillian Rein, MD  Misc Natural Products (GLUCOSAMINE CHOND COMPLEX/MSM PO) Take 1 tablet by mouth 2 (two)  times daily.    [provider]  Multiple Vitamin (MULTIVITAMIN) tablet Take 1 tablet by mouth daily.    [provider]  mycophenolate (MYFORTIC) 180 MG EC tablet Take 180 mg by mouth 2 (two) times daily. Takes 3 in the morning and 3 in the evening    [provider]  Naftifine HCl 2 % CREA Apply 1 application  topically daily as needed. Apply As needed to foot 02/26/15   [provider]  Omega-3 Fatty Acids (FISH OIL) 1000 MG CAPS Take 1 capsule by mouth in the morning and at bedtime. 01/22/21   [provider]  pantoprazole  (PROTONIX ) 40 MG tablet Take 40 mg by mouth daily.    [provider]  Probiotic Product (PROBIOTIC  ADVANCED PO) Take by mouth.    [provider]  sirolimus  (RAPAMUNE ) 1 MG tablet Take 2 mg by mouth daily. 01/01/19   [provider]  traMADol  (ULTRAM ) 50 MG tablet Take 1 tablet (50 mg total) by mouth every 12 (twelve) hours as needed. 03/01/19   Petrarca, Brian D, PA-C  triamcinolone  ointment (KENALOG ) 0.5 % Apply 1 application. topically 2 (two) times daily. Apply small amount topically for no longer than 14 days 01/28/22   Lillian Rein, MD  Turmeric 400 MG CAPS by Does not apply route daily.    [provider]      Allergies    Nsaids, Other, Sulfa antibiotics, and Tolmetin    Review of Systems   Review of Systems  Physical Exam Updated Vital Signs BP 130/73   Pulse 61   Temp 98.1 F (36.7 C) (Oral)   Resp 13   Ht 5\' 6"  (1.676 m)   Wt 74.8 kg   LMP 06/01/2012   SpO2 93%   BMI 26.63 kg/m  Physical Exam Vitals and nursing note reviewed.  Constitutional:      General: She is not in acute distress.    Appearance: She is well-developed.  HENT:     Head: Normocephalic and atraumatic.  Eyes:     Conjunctiva/sclera: Conjunctivae normal.  Pulmonary:     Effort: Pulmonary effort is normal. No respiratory distress.     Breath sounds: No stridor.  Abdominal:     General: There is distension.     Tenderness: There is generalized abdominal tenderness. There is guarding.  Skin:    General: Skin is warm and dry.  Neurological:     Mental Status: She is alert and oriented to person, place, and time.     Cranial Nerves: No cranial nerve deficit.  Psychiatric:        Mood and Affect: Mood normal.     ED Results / Procedures / Treatments   Labs (all labs ordered are listed, but only abnormal results are displayed) Labs Reviewed  COMPREHENSIVE METABOLIC PANEL WITH GFR - Abnormal; Notable for the following components:      Result Value   Sodium 129 (*)    Potassium 2.8 (*)    Chloride 90 (*)    Glucose, Bld 150 (*)    Albumin 3.4 (*)    Total  Bilirubin 1.3 (*)    All other components within normal limits  CBC - Abnormal; Notable for the following components:   WBC 11.0 (*)    RBC 5.42 (*)    All other components within normal limits  LIPASE, BLOOD  URINALYSIS, ROUTINE W REFLEX MICROSCOPIC    EKG None  Radiology No results found.  Procedures Procedures  Medications Ordered in ED Medications  iohexol  (OMNIPAQUE ) 9 MG/ML oral solution 500 mL (0 mLs Oral Hold 04/03/24 2356)  HYDROmorphone  (DILAUDID ) injection 1 mg (1 mg Intramuscular Given 04/03/24 1933)  metoCLOPramide (REGLAN) injection 10 mg (10 mg Intramuscular Given 04/03/24 1934)  0.9 % NaCl with KCl 20 mEq/ L  infusion (0 mL/hr Intravenous Stopped 04/03/24 2355)    ED Course/ Medical Decision Making/ A&P                                 Medical Decision Making Elderly female with prior diverticulitis, polyneuropathy, kidney transplant presents with abdominal pain, concern for obstruction versus infection versus ileus.  Patient is awake, alert, afebrile, lower suspicion for infection. CT labs ordered.  Initial labs notable for hypokalemia, this was supplemented.  Amount and/or Complexity of Data Reviewed Independent Historian: friend External Data Reviewed: notes.    Details: GI visit Labs:  Decision-making details documented in ED Course. Radiology: ordered and independent interpretation performed. Decision-making details documented in ED Course.  Risk Prescription drug management. Decision regarding hospitalization. Diagnosis or treatment significantly limited by social determinants of health.   Update: Patient's initial labs notable for mild hypokalemia she has received fluid resuscitation with potassium supplementation, is awaiting CT imaging. Vitals remained stable, on signout patient awaiting CT.        Final Clinical Impression(s) / ED Diagnoses Final diagnoses:  Generalized abdominal pain    Rx / DC Orders ED Discharge Orders     None          Dorenda Gandy, MD 04/03/24 2358

## 2024-04-03 NOTE — ED Triage Notes (Signed)
 Pt arrives via EMS from home. EMS reports abdominal pain and constipation for the past 4 weeks. Pt reported associated nausea, vomiting, and abdominal distention.

## 2024-04-03 NOTE — ED Notes (Signed)
 Pt reports she has been seen by her GI doctor several times for same.  Sts several chronic GI issues and sts constipation recently started.

## 2024-04-03 NOTE — ED Provider Triage Note (Signed)
 Emergency Medicine Provider Triage Evaluation Note  Linda Gilmore , a 68 y.o. female  was evaluated in triage.  Pt complains of nausea.  Review of Systems  Positive:  Negative:   Physical Exam  BP 120/75 (BP Location: Right Arm)   Pulse 71   Temp 98.1 F (36.7 C) (Oral)   Resp 18   Ht 5\' 6"  (1.676 m)   Wt 74.8 kg   LMP 06/01/2012   SpO2 97%   BMI 26.63 kg/m  Gen:   Awake, no distress   Resp:  Normal effort  MSK:   Moves extremities without difficulty  Other:    Medical Decision Making  Medically screening exam initiated at 7:27 PM.  Appropriate orders placed.  Linda Gilmore was informed that the remainder of the evaluation will be completed by another provider, this initial triage assessment does not replace that evaluation, and the importance of remaining in the ED until their evaluation is complete.  Symptoms of nausea for the past 4 weeks, alternating diarrhea and constipation. First was though to be her biliary dyskinesia but symptoms persist.    Mandy Second, PA-C 04/03/24 1928

## 2024-04-04 ENCOUNTER — Emergency Department (HOSPITAL_COMMUNITY)

## 2024-04-04 LAB — URINALYSIS, ROUTINE W REFLEX MICROSCOPIC
Bilirubin Urine: NEGATIVE
Glucose, UA: NEGATIVE mg/dL
Hgb urine dipstick: NEGATIVE
Ketones, ur: 5 mg/dL — AB
Leukocytes,Ua: NEGATIVE
Nitrite: NEGATIVE
Protein, ur: NEGATIVE mg/dL
Specific Gravity, Urine: 1.02 (ref 1.005–1.030)
pH: 5 (ref 5.0–8.0)

## 2024-04-04 MED ORDER — SMOG ENEMA
400.0000 mL | Freq: Once | RECTAL | Status: AC
  Administered 2024-04-04: 400 mL via RECTAL
  Filled 2024-04-04: qty 960

## 2024-04-04 NOTE — ED Provider Notes (Signed)
 4:10 AM Large BM post enema.  Stable for discharge.    Nekeshia Lenhardt, MD 04/04/24 0272

## 2024-04-04 NOTE — Discharge Instructions (Signed)
 Start Miralax twice daily for 1 week.

## 2024-04-08 NOTE — Progress Notes (Unsigned)
 Linda Gilmore, female    DOB: 01-May-1956   MRN: [id number]   Brief patient profile:  23 wf never smoker from [facility] healthy child with onset sinus/ bronchitis late 1970s  ENT  eval ? Needed surgery but never done   then at [facility]  1996 all resp resp symptoms resolved until cough started 2020 (never covid)   then rhnitis recurred spring = fall  2021 better with nasal steroids but did not help cough  referred to pulmonary clinic 04/12/2024 by Ronna Coho  for nightly cough x 2020       Bp pill caused cough  Allegy eval late 90s > pos mold feathers dust mites   2017 renal transplant     Pt not previously seen by PCCM service.    History of Present Illness  04/12/2024  Pulmonary/ 1st office eval/Maxene [facility]    Pantoprazole  40 mg x 1.5 y with cough x 2020  Chief Complaint  Patient presents with   Cough    Mostly a dry cough, sometimes a wet cough. Can be clear mucous. Pt states there was a period where she kept mucous in her throat and it was hard to get it out. X5 years. Pt states this was since she had covid. Pt states the cough can make her vomit   Dyspnea:  Not limited by breathing from desired activities   Cough: dry with onset  at hs or  min to hours p lying down, can be severe to poin to vomit  Sleep: bed is flat/ 2 pillow s SABA use: none  02 WUJ:[id number]    No obvious other patterns in  day to day or daytime  variability or assoc excess/ purulent sputum or mucus plugs or hemoptysis or cp or chest tightness, subjective wheeze or overt sinus or hb symptoms.   Also denies any obvious fluctuation of symptoms with weather or environmental changes or other aggravating or alleviating factors except as outlined above   No unusual exposure hx or h/o childhood pna/ asthma or knowledge of premature birth.  Current Allergies, Complete Past Medical History, Past Surgical History, Family History, and Social History were reviewed in [employer] record.  ROS  The following are not  active complaints unless bolded Hoarseness, sore throat, dysphagia, dental problems, itching, sneezing,  nasal congestion or sense of discharge of excess mucus or purulent secretions, ear ache,   fever, chills, sweats, unintended wt loss or wt gain, classically pleuritic or exertional cp,  orthopnea pnd or arm/hand swelling  or leg swelling, presyncope, palpitations, abdominal pain, anorexia, nausea, vomiting, diarrhea  or change in bowel habits or change in bladder habits, change in stools or change in urine, dysuria, hematuria,  rash, arthralgias, visual complaints, headache, numbness, weakness or ataxia or problems with walking or coordination,  change in mood or  memory.             Outpatient Medications Prior to Visit  Medication Sig Dispense Refill   aspirin EC 81 MG tablet Take 81 mg by mouth.     atorvastatin (LIPITOR) 10 MG tablet Take 10 mg by mouth every evening.      Biotin 10 MG CAPS Take by mouth.     carvedilol (COREG) 6.25 MG tablet Take 6.25 mg by mouth 2 (two) times daily.     Cholecalciferol (VITAMIN D3) 5000 units TABS Take 1 tablet by mouth daily.     co-enzyme Q-10 30 MG capsule Take 30 mg by mouth 3 (three)  times daily.     diclofenac  Sodium (VOLTAREN ) 1 % GEL Apply 2 g topically 4 (four) times daily. She going to purchase OTC.     DULoxetine  (CYMBALTA ) 30 MG capsule TAKE 1 CAPSULE BY MOUTH EVERY DAY 90 capsule 3   estradiol  (ESTRACE  VAGINAL) 0.1 MG/GM vaginal cream 1 gram pv twice weekly 42.5 g 4   ezetimibe (ZETIA) 10 MG tablet Take 1 tablet by mouth daily.     famotidine (PEPCID) 40 MG tablet Take 40 mg by mouth 2 (two) times daily.     FIBER ADULT GUMMIES PO Take by mouth.     gabapentin (NEURONTIN) 100 MG capsule Take 100 mg by mouth.  three caps QHS.     Homeopathic Products (LEG CRAMP RELIEF PO) Take 2 tablets by mouth daily as needed. For leg cramps     hydrochlorothiazide (HYDRODIURIL) 25 MG tablet Take 25 mg by mouth daily.     hydrocortisone  (ANUSOL -HC) 2.5  % rectal cream Place rectally 2 (two) times daily. Do not use for more than 7 days. 30 g 1   KLOR-CON  M10 10 MEQ tablet Take 20 mEq by mouth daily.     lidocaine  (XYLOCAINE ) 5 % ointment Apply topically up to three times daily for hemorrhoid pain.  Do not use for more then 5 days in a row. 30 g 0   linaclotide (LINZESS) 290 MCG CAPS capsule Take 290 mcg by mouth daily before breakfast.     loratadine (CLARITIN) 10 MG tablet Take 10 mg by mouth daily as needed.     methocarbamol  (ROBAXIN ) 500 MG tablet Take 1 tablet (500 mg total) by mouth 2 (two) times daily as needed for muscle spasms. 30 tablet 0   mirabegron  ER (MYRBETRIQ ) 50 MG TB24 tablet Take 1 tablet (50 mg total) by mouth daily. 90 tablet 0   Misc Natural Products (GLUCOSAMINE CHOND COMPLEX/MSM PO) Take 1 tablet by mouth 2 (two) times daily.     Multiple Vitamin (MULTIVITAMIN) tablet Take 1 tablet by mouth daily.     mycophenolate (MYFORTIC) 180 MG EC tablet Take 180 mg by mouth 2 (two) times daily. Takes 3 in the morning and 3 in the evening     Naftifine HCl 2 % CREA Apply 1 application  topically daily as needed. Apply As needed to foot     Omega-3 Fatty Acids (FISH OIL) 1000 MG CAPS Take 1 capsule by mouth in the morning and at bedtime.     pantoprazole  (PROTONIX ) 40 MG tablet Take 40 mg by mouth daily.     Probiotic Product (PROBIOTIC ADVANCED PO) Take by mouth.     sirolimus  (RAPAMUNE ) 1 MG tablet Take 2 mg by mouth daily.     traMADol  (ULTRAM ) 50 MG tablet Take 1 tablet (50 mg total) by mouth every 12 (twelve) hours as needed. 30 tablet 0   Turmeric 400 MG CAPS by Does not apply route daily.     triamcinolone  ointment (KENALOG ) 0.5 % Apply 1 application. topically 2 (two) times daily. Apply small amount topically for no longer than 14 days 30 g 0   No facility-administered medications prior to visit.    Past Medical History:  Diagnosis Date   Anemia    Arthritis    Bruises easily    CKD (chronic kidney disease)     Diverticulitis 06/20/2012   Diverticulosis    Hemorrhoids    Hyperlipidemia    Hypertension    Neuropathic pain    Pleurisy 10 YRS AGO  Polycystic kidney disease    LOV NOTE DR FOX 04-13-2013 ON CHART   Presence of pessary    Stroke (HCC) 11/01/2009   AFTER ANEURYSM DISSECTION, AREA HEALED ON ITS OWN   Uterine fibroid       Objective:     BP 126/64 (BP Location: Left Arm, Patient Position: Sitting, Cuff Size: Normal)   Pulse 72   Temp 97.7 F (36.5 C) (Temporal)   Ht 5' 7 (1.702 m)   Wt 265 lb 9.6 oz (120.5 kg)   LMP 06/01/2012   SpO2 95%   BMI 41.60 kg/m   SpO2: 95 %  RA   Amb pleasant amb wf nad   HEENT : Oropharynx  slt erythema s cobblestoning or pnds     Nasal turbinates nl / no coughing on exar exm    NECK :  without  apparent JVD/ palpable Nodes/TM    LUNGS: no acc muscle use,  Nl contour chest which is clear to A and P bilaterally without cough on insp or exp maneuvers   CV:  RRR  no s3 or murmur or increase in P2, and no edema   ABD:  soft and nontender   MS:  Gait nl   ext warm without deformities Or obvious joint restrictions  calf tenderness, cyanosis or clubbing    SKIN: warm and dry without lesions    NEURO:  alert, approp, nl sensorium with  no motor or cerebellar deficits apparent.   Multiple cxr's nl as recently as feb 2025     I personally reviewed images and agree with radiology impression as follows:   Chest CT cuts on abd CT  04/04/24   Eventration of the right hemidiaphragm associated with right basilar atelectasis.       Assessment   No problem-specific Assessment & Plan notes found for this encounter.     Vernestine Gondola, MD 04/12/2024

## 2024-04-12 ENCOUNTER — Ambulatory Visit: Admitting: Internal Medicine

## 2024-04-12 ENCOUNTER — Encounter: Payer: Self-pay | Admitting: Internal Medicine

## 2024-04-12 VITALS — BP 126/64 | HR 72 | Temp 97.7°F | Ht 67.0 in | Wt 265.6 lb

## 2024-04-12 DIAGNOSIS — R058 Other specified cough: Secondary | ICD-10-CM

## 2024-04-12 NOTE — Assessment & Plan Note (Signed)
 Onset was around 2020 mostly noct  -  max gerd rx and 1st gen H1 blockers per guidelines   Upper airway cough syndrome (previously labeled PNDS),  is so named because it's frequently impossible to sort out how much is  CR/sinusitis with freq throat clearing (which can be related to primary GERD)   vs  causing  secondary ( extra esophageal)  GERD from wide swings in gastric pressure that occur with throat clearing, often  promoting self use of mint and menthol lozenges that reduce the lower esophageal sphincter tone and exacerbate the problem further in a cyclical fashion.   These are the same pts (now being labeled as having irritable larynx syndrome by some cough centers) who not infrequently have a history of having failed to tolerate ace inhibitors(as is the case here) ,  dry powder inhalers or biphosphonates or report having atypical/extraesophageal reflux symptoms(LPR/ cough to vomit)  that don't respond to standard doses of PPI  and are easily confused as having aecopd or asthma flares by even experienced allergists/ pulmonologists (myself included).   Rec: Bed blocks Max gerd rx/ diet/elevation of HOB and 1st gen H1 blockers per guidelines    F/u 6 weeks  - if not improving would consider increasing gabapentin to max of 1200 mg per day or eval by DR Marne Sings at [facility] voice center.   Discussed in detail all the  indications, usual  risks and alternatives  relative to the benefits with patient who agrees to proceed with Rx as outlined.        Each maintenance medication was reviewed in detail including emphasizing most importantly the difference between maintenance and prns and under what circumstances the prns are to be triggered using an action plan format where appropriate.  Total time for H and P, chart review, counseling,   and generating customized AVS unique to this office visit / same day charting = 60 m  for longstanding  refractory respiratory  symptoms of uncertain etiology in a  very complex transplant pt new to me

## 2024-04-12 NOTE — Patient Instructions (Signed)
 Pantoprazole  (protonix ) 40 mg   Take  30-60 min before first meal of the day and Pepcid (famotidine)  20 mg after supper until return to office - this is the best way to tell whether stomach acid is contributing to your problem.  GERD (REFLUX)  is an extremely common cause of respiratory symptoms just like yours , many times with no obvious heartburn at all.    It can be treated with medication, but also with lifestyle changes including elevation of the head of your bed (ideally with 6 -8inch blocks under the headboard of your bed),  Smoking cessation, avoidance of late meals, excessive alcohol, and avoid fatty foods, chocolate, peppermint, colas, red wine, and acidic juices such as orange juice.  NO MINT OR MENTHOL PRODUCTS SO NO COUGH DROPS  USE SUGARLESS CANDY INSTEAD (Jolley ranchers or Stover's or Life Savers) or even ice chips will also do - the key is to swallow to prevent all throat clearing. NO OIL BASED VITAMINS - use powdered substitutes.  Avoid fish oil when coughing.   For drainage / throat tickle try take CHLORPHENIRAMINE  4 mg  (Allergy Relief 4mg   at [facility] should be easiest to find in the blue box usually on bottom shelf)  take one every 4 hours as needed - extremely effective and inexpensive over the counter- may cause drowsiness so start with just a dose or two an hour before bedtime and see how you tolerate it before trying in daytime.   Stop clariton, ok to use nasacort    Please schedule a follow up office visit in 6 weeks, call sooner if needed

## 2024-04-16 DIAGNOSIS — Z940 Kidney transplant status: Principal | ICD-10-CM

## 2024-04-25 ENCOUNTER — Telehealth: Payer: Managed Care, Other (non HMO) | Admitting: Neurology

## 2024-05-07 ENCOUNTER — Encounter (HOSPITAL_COMMUNITY): Payer: Self-pay | Admitting: *Deleted

## 2024-05-07 ENCOUNTER — Ambulatory Visit (INDEPENDENT_AMBULATORY_CARE_PROVIDER_SITE_OTHER): Admitting: Otolaryngology

## 2024-05-07 VITALS — BP 139/80 | HR 69

## 2024-05-07 DIAGNOSIS — K219 Gastro-esophageal reflux disease without esophagitis: Secondary | ICD-10-CM | POA: Diagnosis not present

## 2024-05-07 DIAGNOSIS — J343 Hypertrophy of nasal turbinates: Secondary | ICD-10-CM

## 2024-05-07 DIAGNOSIS — R053 Chronic cough: Secondary | ICD-10-CM | POA: Diagnosis not present

## 2024-05-07 DIAGNOSIS — J3089 Other allergic rhinitis: Secondary | ICD-10-CM

## 2024-05-07 DIAGNOSIS — R0981 Nasal congestion: Secondary | ICD-10-CM

## 2024-05-07 DIAGNOSIS — R0982 Postnasal drip: Secondary | ICD-10-CM | POA: Diagnosis not present

## 2024-05-07 DIAGNOSIS — J342 Deviated nasal septum: Secondary | ICD-10-CM

## 2024-05-07 MED ORDER — FLUTICASONE PROPIONATE 50 MCG/ACT NA SUSP: 2.0000 | Freq: Every day | NASAL | 6 refills | Status: AC

## 2024-05-07 MED ORDER — SALINE SPRAY 0.65 % NA SOLN: 1.0000 | NASAL | 5 refills | Status: AC | PRN
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
## 2024-05-07 NOTE — Patient Instructions (Signed)

## 2024-05-16 DIAGNOSIS — Z940 Kidney transplant status: Principal | ICD-10-CM

## 2024-05-18 ENCOUNTER — Other Ambulatory Visit: Payer: Self-pay | Admitting: Neurology

## 2024-05-21 DIAGNOSIS — Z940 Kidney transplant status: Principal | ICD-10-CM

## 2024-05-21 DIAGNOSIS — E7800 Pure hypercholesterolemia, unspecified: Principal | ICD-10-CM

## 2024-05-21 MED ORDER — EZETIMIBE 10 MG TABLET
ORAL_TABLET | Freq: Every day | ORAL | 3 refills | 90.00000 days | Status: CP
Start: 2024-05-21 — End: 2025-05-21

## 2024-05-21 MED ORDER — FAMOTIDINE 10 MG TABLET
ORAL_TABLET | Freq: Every day | ORAL | 3 refills | 90.00000 days | Status: CP
Start: 2024-05-21 — End: 2025-05-21

## 2024-05-21 MED ORDER — MYCOPHENOLATE SODIUM 180 MG TABLET,DELAYED RELEASE
ORAL_TABLET | Freq: Two times a day (BID) | ORAL | 3 refills | 90.00000 days | Status: CP
Start: 2024-05-21 — End: 2025-05-21

## 2024-05-21 MED ORDER — POTASSIUM CHLORIDE ER 10 MEQ TABLET,EXTENDED RELEASE(PART/CRYST)
ORAL_TABLET | Freq: Every day | ORAL | 3 refills | 90.00000 days | Status: CP
Start: 2024-05-21 — End: 2025-05-21

## 2024-05-21 MED ORDER — ATORVASTATIN 10 MG TABLET
ORAL_TABLET | Freq: Every day | ORAL | 3 refills | 90.00000 days | Status: CP
Start: 2024-05-21 — End: ?

## 2024-05-21 MED ORDER — GABAPENTIN 100 MG CAPSULE
ORAL_CAPSULE | Freq: Every evening | ORAL | 3 refills | 90.00000 days | Status: CP
Start: 2024-05-21 — End: 2025-05-22

## 2024-05-21 MED ORDER — OMEGA-3 ACID ETHYL ESTERS 1 GRAM CAPSULE
ORAL_CAPSULE | Freq: Every day | ORAL | 3 refills | 90.00000 days | Status: CP
Start: 2024-05-21 — End: ?

## 2024-05-21 MED ORDER — SIROLIMUS 1 MG TABLET
ORAL_TABLET | Freq: Every day | ORAL | 3 refills | 90.00000 days | Status: CP
Start: 2024-05-21 — End: 2025-05-21

## 2024-05-21 MED ORDER — CARVEDILOL 6.25 MG TABLET
ORAL_TABLET | Freq: Two times a day (BID) | ORAL | 3 refills | 90.00000 days | Status: CP
Start: 2024-05-21 — End: 2025-05-22

## 2024-05-21 MED ORDER — HYDROCHLOROTHIAZIDE 25 MG TABLET
ORAL_TABLET | Freq: Every day | ORAL | 3 refills | 90.00000 days | Status: CP
Start: 2024-05-21 — End: 2025-05-21

## 2024-05-28 ENCOUNTER — Encounter (HOSPITAL_COMMUNITY): Payer: Self-pay | Admitting: *Deleted

## 2024-05-29 ENCOUNTER — Encounter (HOSPITAL_COMMUNITY): Payer: Self-pay | Admitting: *Deleted

## 2024-05-31 ENCOUNTER — Other Ambulatory Visit (HOSPITAL_BASED_OUTPATIENT_CLINIC_OR_DEPARTMENT_OTHER): Payer: Self-pay | Admitting: Obstetrics & Gynecology

## 2024-05-31 DIAGNOSIS — N3281 Overactive bladder: Secondary | ICD-10-CM

## 2024-05-31 MED ORDER — MIRABEGRON ER 50 MG PO TB24: 50.0000 mg | ORAL_TABLET | Freq: Every day | ORAL | 1 refills | Status: DC

## 2024-06-01 MED ORDER — OMEGA-3 ACID ETHYL ESTERS 1 GRAM CAPSULE
ORAL_CAPSULE | ORAL | 3 refills | 0.00000 days | Status: CP
Start: 2024-06-01 — End: ?

## 2024-06-04 ENCOUNTER — Ambulatory Visit (HOSPITAL_BASED_OUTPATIENT_CLINIC_OR_DEPARTMENT_OTHER): Admitting: Obstetrics & Gynecology

## 2024-06-06 ENCOUNTER — Telehealth (INDEPENDENT_AMBULATORY_CARE_PROVIDER_SITE_OTHER): Admitting: Neurology

## 2024-06-06 DIAGNOSIS — Z940 Kidney transplant status: Principal | ICD-10-CM

## 2024-06-06 DIAGNOSIS — R202 Paresthesia of skin: Secondary | ICD-10-CM | POA: Diagnosis not present

## 2024-06-06 DIAGNOSIS — G4733 Obstructive sleep apnea (adult) (pediatric): Secondary | ICD-10-CM | POA: Diagnosis not present

## 2024-06-06 MED ORDER — OMEGA-3 ACID ETHYL ESTERS 1 GRAM CAPSULE
ORAL_CAPSULE | Freq: Two times a day (BID) | ORAL | 3 refills | 90.00000 days | Status: CP
Start: 2024-06-06 — End: 2024-06-06

## 2024-06-06 NOTE — Patient Instructions (Signed)
 Restart CPAP nightly minimum 4 hours, current settings.  If continues with difficulty tolerating CPAP please reach out and let me know.  Will consider a CPAP titration study.
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
## 2024-06-07 ENCOUNTER — Encounter (HOSPITAL_BASED_OUTPATIENT_CLINIC_OR_DEPARTMENT_OTHER): Payer: Self-pay

## 2024-06-15 DIAGNOSIS — Z940 Kidney transplant status: Principal | ICD-10-CM

## 2024-06-19 DIAGNOSIS — N184 Chronic kidney disease, stage 4 (severe): Principal | ICD-10-CM

## 2024-06-19 DIAGNOSIS — Z79899 Other long term (current) drug therapy: Principal | ICD-10-CM

## 2024-06-19 DIAGNOSIS — Z940 Kidney transplant status: Principal | ICD-10-CM

## 2024-06-21 ENCOUNTER — Encounter (HOSPITAL_COMMUNITY): Payer: Self-pay | Admitting: *Deleted

## 2024-06-22 ENCOUNTER — Other Ambulatory Visit: Admit: 2024-06-22 | Discharge: 2024-06-23 | Payer: MEDICARE

## 2024-06-22 ENCOUNTER — Ambulatory Visit: Admit: 2024-06-22 | Discharge: 2024-06-23 | Payer: MEDICARE | Attending: Nephrology | Primary: Nephrology

## 2024-06-22 DIAGNOSIS — Z940 Kidney transplant status: Principal | ICD-10-CM

## 2024-06-22 DIAGNOSIS — Z79899 Other long term (current) drug therapy: Principal | ICD-10-CM

## 2024-06-22 DIAGNOSIS — Z006 Encounter for examination for normal comparison and control in clinical research program: Principal | ICD-10-CM

## 2024-06-22 DIAGNOSIS — R5383 Other fatigue: Principal | ICD-10-CM

## 2024-06-22 DIAGNOSIS — N184 Chronic kidney disease, stage 4 (severe): Principal | ICD-10-CM

## 2024-06-22 MED ORDER — CARVEDILOL 12.5 MG TABLET
ORAL_TABLET | Freq: Two times a day (BID) | ORAL | 3 refills | 90.00000 days | Status: CP
Start: 2024-06-22 — End: 2025-06-23

## 2024-06-27 ENCOUNTER — Encounter (HOSPITAL_COMMUNITY): Payer: Self-pay | Admitting: *Deleted

## 2024-07-03 DIAGNOSIS — Z940 Kidney transplant status: Principal | ICD-10-CM

## 2024-07-04 ENCOUNTER — Other Ambulatory Visit (HOSPITAL_COMMUNITY)
Admission: RE | Admit: 2024-07-04 | Discharge: 2024-07-04 | Disposition: A | Discharge status: Home / Self Care | Source: Ambulatory Visit | Attending: Obstetrics & Gynecology | Admitting: Obstetrics & Gynecology

## 2024-07-04 ENCOUNTER — Ambulatory Visit (INDEPENDENT_AMBULATORY_CARE_PROVIDER_SITE_OTHER): Admitting: Obstetrics & Gynecology

## 2024-07-04 ENCOUNTER — Encounter (HOSPITAL_BASED_OUTPATIENT_CLINIC_OR_DEPARTMENT_OTHER): Payer: Self-pay | Admitting: Obstetrics & Gynecology

## 2024-07-04 VITALS — HR 64 | Ht 67.0 in | Wt 152.4 lb

## 2024-07-04 DIAGNOSIS — Z1151 Encounter for screening for human papillomavirus (HPV): Secondary | ICD-10-CM | POA: Diagnosis not present

## 2024-07-04 DIAGNOSIS — N3281 Overactive bladder: Secondary | ICD-10-CM

## 2024-07-04 DIAGNOSIS — Z124 Encounter for screening for malignant neoplasm of cervix: Secondary | ICD-10-CM | POA: Insufficient documentation

## 2024-07-04 DIAGNOSIS — Z01419 Encounter for gynecological examination (general) (routine) without abnormal findings: Secondary | ICD-10-CM

## 2024-07-04 DIAGNOSIS — R931 Abnormal findings on diagnostic imaging of heart and coronary circulation: Secondary | ICD-10-CM

## 2024-07-04 DIAGNOSIS — N952 Postmenopausal atrophic vaginitis: Secondary | ICD-10-CM

## 2024-07-04 DIAGNOSIS — N812 Incomplete uterovaginal prolapse: Secondary | ICD-10-CM

## 2024-07-04 DIAGNOSIS — Z940 Kidney transplant status: Secondary | ICD-10-CM

## 2024-07-04 MED ORDER — ESTRADIOL 0.1 MG/GM VA CREA: TOPICAL_CREAM | VAGINAL | 4 refills | Status: AC

## 2024-07-04 MED ORDER — MIRABEGRON ER 50 MG PO TB24
50.0000 mg | ORAL_TABLET | Freq: Every day | ORAL | 3 refills | Status: DC
Start: 2024-07-04 — End: 2024-09-18

## 2024-07-04 NOTE — Progress Notes (Signed)
 Breast and Pelvic Exam Patient name: Linda Gilmore MRN [id number]  Date of birth: [birth year] Chief Complaint:   Breast and Pelvic Exam  History of Present Illness:   Linda Gilmore is a 68 y.o. G1P1 Caucasian female being seen today for breast and pelvic exam.  Denies vaginal bleeding.  Not using a pessary at this time.  Stopped when she was in pelvic physical therapy.  Doesn't leak unless bladder is really full.  She is wearing a pad some now.  H/o OAB and taking myrbetriq .    Still followed at [facility] for hx of renal transplant.  Most recent creatinine was 1.11  Her mother passed away since I saw her last.  She just retired in June.  Had nausea, diarrhea and emesis.  She ended up going to the hospital.  Was very dehydrated.  Using linzess some and this has helped.  Has follow up with Dr. Kristie scheduled next week.    H/o osteopenia.  Has seen Dr. Tommas.  Prolia recommended.  Wasn't covered by her insurance at that time.  She is now on Medicare.     Patient's last menstrual period was 06/01/2012.   Last pap 01/22/2021. Results were: NILM w/ HRHPV not done. H/O abnormal pap: no Last mammogram: 11/07/2023. Results were: normal. Family h/o breast cancer: no Last colonoscopy: 11/2023.  Follow up 10 years.  Results were: abnormal diverticulosis. Family h/o colorectal cancer: no DEXA:   12/09/2022 at [facility].  Osteopenia noted with t score -2.2.     07/04/2024    1:45 PM 01/28/2022   11:36 AM 12/30/2021    5:03 PM  Depression screen PHQ 2/9  Decreased Interest 1 0 0  Down, Depressed, Hopeless 1 0 0  PHQ - 2 Score 2 0 0  Altered sleeping 1    Tired, decreased energy 0    Change in appetite 3    Feeling bad or failure about yourself  0    Trouble concentrating 0    Moving slowly or fidgety/restless 0    Suicidal thoughts 0    PHQ-9 Score 6    Difficult doing work/chores Somewhat difficult       Review of Systems:   Pertinent items are noted in HPI Denies any vaginal bleeding bowel  changes.    Pertinent History Reviewed:  Reviewed past medical,surgical, social and family history.  Reviewed problem list, medications and allergies. Physical Assessment:   Vitals:   07/04/24 1333  Pulse: 64  SpO2: 97%  Weight: 152 lb 6.4 oz (69.1 kg)  Height: 5' 7 (1.702 m)  Body mass index is 23.87 kg/m.        Physical Examination:   General appearance - well appearing, and in no distress  Mental status - alert, oriented to person, place, and time  Psych:  She has a normal mood and affect  Skin - warm and dry, normal color, no suspicious lesions noted  Chest - effort normal, all lung fields clear to auscultation bilaterally  Heart - normal rate and regular rhythm  Neck:  midline trachea, no thyromegaly or nodules  Breasts - breasts appear normal, no suspicious masses, no skin or nipple changes or  axillary nodes  Abdomen - soft, nontender, nondistended, no masses or organomegaly  Pelvic - VULVA: normal appearing vulva with no masses, tenderness or lesions   VAGINA: vaginal atrophy, scarring of vaginal apex (cannot see cervical os)   CERVIX: normal appearing cervix without discharge or lesions, no CMT  Thin  prep pap is done  UTERUS: uterus is felt to be normal size, shape, consistency and nontender   ADNEXA: No adnexal masses or tenderness noted.  Rectal - deferred  Extremities:  No swelling or varicosities noted  Chaperone present for exam  No results found for this or any previous visit (from the past 24 hours).  Assessment & Plan:  1. Encounter for breast and pelvic examination (Primary) - Pap smear updated today - Mammogram 11/2023 - Colonoscopy 11/2023 - Bone mineral density done at [facility] 2024 - vaccines reviewed/updated.  I only have one shingle vaccine date.  She will get me the second date for updating records  2. Cervical cancer screening - Cytology - PAP( [facility]) - PR OBTAINING SCREEN PAP SMEAR  3. OAB (overactive bladder) - mirabegron  ER (MYRBETRIQ )  50 MG TB24 tablet; Take 1 tablet (50 mg total) by mouth daily.  Dispense: 90 tablet; Refill: 3  4. Incomplete uterine prolapse  5. Kidney transplanted - followed at [facility]  6. Elevated coronary artery calcium score - having calcium and PTH levels repeated  7. Vaginal atrophy - RF for estradiol  cram to pharmacy.  1gram pv twice weekly dosing discussed.     Orders Placed This Encounter  Procedures   PR OBTAINING SCREEN PAP SMEAR    Meds:  Meds ordered this encounter  Medications   mirabegron  ER (MYRBETRIQ ) 50 MG TB24 tablet    Sig: Take 1 tablet (50 mg total) by mouth daily.    Dispense:  90 tablet    Refill:  3    Please give branded prescription if covered by insurance   estradiol  (ESTRACE  VAGINAL) 0.1 MG/GM vaginal cream    Sig: 1 gram pv twice weekly    Dispense:  42.5 g    Refill:  4    Follow-up: Return in about 1 year (around 07/04/2025).  Ronal GORMAN Pinal, MD 07/04/2024 2:54 PM

## 2024-07-07 MED ORDER — POTASSIUM CHLORIDE ER 20 MEQ TABLET,EXTENDED RELEASE(PART/CRYST)
ORAL_TABLET | Freq: Every day | ORAL | 3 refills | 0.00000 days
Start: 2024-07-07 — End: ?

## 2024-07-09 DIAGNOSIS — Z940 Kidney transplant status: Principal | ICD-10-CM

## 2024-07-10 ENCOUNTER — Ambulatory Visit (HOSPITAL_BASED_OUTPATIENT_CLINIC_OR_DEPARTMENT_OTHER): Payer: Self-pay | Admitting: Obstetrics & Gynecology

## 2024-07-10 ENCOUNTER — Encounter (HOSPITAL_BASED_OUTPATIENT_CLINIC_OR_DEPARTMENT_OTHER): Payer: Self-pay | Admitting: Obstetrics & Gynecology

## 2024-07-10 LAB — CYTOLOGY - PAP
Comment: NEGATIVE
Diagnosis: UNDETERMINED — AB
High risk HPV: NEGATIVE

## 2024-07-10 MED ORDER — POTASSIUM CHLORIDE ER 20 MEQ TABLET,EXTENDED RELEASE(PART/CRYST)
ORAL_TABLET | Freq: Every day | ORAL | 3 refills | 90.00000 days | Status: CP
Start: 2024-07-10 — End: ?

## 2024-07-11 ENCOUNTER — Encounter (HOSPITAL_COMMUNITY): Payer: Self-pay | Admitting: *Deleted

## 2024-07-27 ENCOUNTER — Encounter: Payer: Self-pay | Admitting: Physician Assistant

## 2024-07-27 ENCOUNTER — Ambulatory Visit (INDEPENDENT_AMBULATORY_CARE_PROVIDER_SITE_OTHER): Admitting: Physician Assistant

## 2024-07-27 DIAGNOSIS — M1711 Unilateral primary osteoarthritis, right knee: Secondary | ICD-10-CM | POA: Diagnosis not present

## 2024-07-27 MED ORDER — HYALURONAN 88 MG/4ML IX SOSY
88.0000 mg | PREFILLED_SYRINGE | INTRA_ARTICULAR | Status: AC | PRN
  Administered 2024-07-27: 88 mg via INTRA_ARTICULAR
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
## 2024-07-31 ENCOUNTER — Ambulatory Visit: Payer: PRIVATE HEALTH INSURANCE | Admitting: Physician Assistant

## 2024-08-01 DIAGNOSIS — Z940 Kidney transplant status: Principal | ICD-10-CM

## 2024-08-03 ENCOUNTER — Ambulatory Visit: Payer: PRIVATE HEALTH INSURANCE | Admitting: Physician Assistant

## 2024-08-06 ENCOUNTER — Encounter (INDEPENDENT_AMBULATORY_CARE_PROVIDER_SITE_OTHER): Payer: Self-pay | Admitting: Otolaryngology

## 2024-08-06 ENCOUNTER — Ambulatory Visit (INDEPENDENT_AMBULATORY_CARE_PROVIDER_SITE_OTHER): Payer: PRIVATE HEALTH INSURANCE | Admitting: Otolaryngology

## 2024-08-06 VITALS — BP 150/86 | HR 67

## 2024-08-06 DIAGNOSIS — R053 Chronic cough: Secondary | ICD-10-CM | POA: Diagnosis not present

## 2024-08-06 DIAGNOSIS — G4733 Obstructive sleep apnea (adult) (pediatric): Secondary | ICD-10-CM

## 2024-08-06 DIAGNOSIS — K219 Gastro-esophageal reflux disease without esophagitis: Secondary | ICD-10-CM | POA: Diagnosis not present

## 2024-08-06 DIAGNOSIS — R0982 Postnasal drip: Secondary | ICD-10-CM | POA: Diagnosis not present

## 2024-08-06 DIAGNOSIS — J3089 Other allergic rhinitis: Secondary | ICD-10-CM

## 2024-08-06 NOTE — Patient Instructions (Signed)
  It was very nice to meet you today,   Please see the following link for the [facility] program   [email or web address]  This website has information about how you can connect to other patients who have had Inspire Implant procedure
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
## 2024-08-14 DIAGNOSIS — Z940 Kidney transplant status: Principal | ICD-10-CM

## 2024-08-14 MED ORDER — AMLODIPINE 2.5 MG TABLET
ORAL_TABLET | Freq: Every day | ORAL | 3 refills | 100.00000 days | Status: CP
Start: 2024-08-14 — End: 2025-09-18

## 2024-08-26 ENCOUNTER — Other Ambulatory Visit: Payer: Self-pay | Admitting: Neurology

## 2024-09-03 ENCOUNTER — Encounter: Payer: Self-pay | Admitting: Radiology

## 2024-09-13 DIAGNOSIS — Z940 Kidney transplant status: Principal | ICD-10-CM

## 2024-09-17 ENCOUNTER — Encounter (HOSPITAL_BASED_OUTPATIENT_CLINIC_OR_DEPARTMENT_OTHER): Payer: Self-pay | Admitting: Obstetrics & Gynecology

## 2024-09-17 DIAGNOSIS — Z940 Kidney transplant status: Principal | ICD-10-CM

## 2024-09-18 ENCOUNTER — Other Ambulatory Visit (HOSPITAL_BASED_OUTPATIENT_CLINIC_OR_DEPARTMENT_OTHER): Payer: Self-pay

## 2024-09-18 DIAGNOSIS — Z940 Kidney transplant status: Principal | ICD-10-CM

## 2024-09-18 DIAGNOSIS — N3281 Overactive bladder: Secondary | ICD-10-CM

## 2024-09-18 MED ORDER — MIRABEGRON ER 50 MG PO TB24: 50.0000 mg | ORAL_TABLET | Freq: Every day | ORAL | 3 refills | Status: AC

## 2024-10-12 DIAGNOSIS — Z940 Kidney transplant status: Principal | ICD-10-CM

## 2024-10-18 ENCOUNTER — Encounter (HOSPITAL_BASED_OUTPATIENT_CLINIC_OR_DEPARTMENT_OTHER): Payer: Self-pay | Admitting: Obstetrics & Gynecology

## 2024-10-19 ENCOUNTER — Other Ambulatory Visit (HOSPITAL_BASED_OUTPATIENT_CLINIC_OR_DEPARTMENT_OTHER): Payer: Self-pay

## 2024-10-19 MED ORDER — MYRBETRIQ 50 MG PO TB24: 50.0000 mg | ORAL_TABLET | Freq: Every day | ORAL | 3 refills | Status: AC

## 2024-11-06 ENCOUNTER — Ambulatory Visit (INDEPENDENT_AMBULATORY_CARE_PROVIDER_SITE_OTHER): Payer: PRIVATE HEALTH INSURANCE | Admitting: Physician Assistant

## 2024-11-06 DIAGNOSIS — M1711 Unilateral primary osteoarthritis, right knee: Secondary | ICD-10-CM | POA: Diagnosis not present

## 2024-11-06 MED ORDER — METHYLPREDNISOLONE ACETATE 40 MG/ML IJ SUSP
40.0000 mg | INTRAMUSCULAR | Status: AC | PRN
  Administered 2024-11-06: 40 mg via INTRA_ARTICULAR

## 2024-11-06 MED ORDER — BUPIVACAINE HCL 0.25 % IJ SOLN
2.0000 mL | INTRAMUSCULAR | Status: AC | PRN
  Administered 2024-11-06: 2 mL via INTRA_ARTICULAR

## 2024-11-06 MED ORDER — LIDOCAINE HCL 1 % IJ SOLN
2.0000 mL | INTRAMUSCULAR | Status: AC | PRN
  Administered 2024-11-06: 2 mL
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
## 2024-11-12 ENCOUNTER — Encounter (HOSPITAL_BASED_OUTPATIENT_CLINIC_OR_DEPARTMENT_OTHER): Payer: Self-pay

## 2024-11-29 DIAGNOSIS — N186 End stage renal disease: Secondary | ICD-10-CM

## 2024-11-29 DIAGNOSIS — Z940 Kidney transplant status: Principal | ICD-10-CM

## 2024-11-29 DIAGNOSIS — Z79899 Other long term (current) drug therapy: Secondary | ICD-10-CM

## 2024-12-26 ENCOUNTER — Telehealth: Admitting: Neurology
